# Patient Record
Sex: Male | Born: 1960
Health system: Southern US, Community
[De-identification: ages and names within clinical notes are randomized; demographics above are authoritative.]

## PROBLEM LIST (undated history)

## (undated) DIAGNOSIS — R0600 Dyspnea, unspecified: Secondary | ICD-10-CM

## (undated) DIAGNOSIS — E785 Hyperlipidemia, unspecified: Secondary | ICD-10-CM

## (undated) DIAGNOSIS — Z720 Tobacco use: Secondary | ICD-10-CM

## (undated) DIAGNOSIS — N529 Male erectile dysfunction, unspecified: Secondary | ICD-10-CM

## (undated) DIAGNOSIS — K219 Gastro-esophageal reflux disease without esophagitis: Secondary | ICD-10-CM

## (undated) DIAGNOSIS — M25519 Pain in unspecified shoulder: Secondary | ICD-10-CM

## (undated) HISTORY — DX: Dyspnea, unspecified: R06.00

## (undated) HISTORY — DX: Tobacco use: Z72.0

## (undated) HISTORY — DX: Male erectile dysfunction, unspecified: N52.9

## (undated) HISTORY — DX: Gastro-esophageal reflux disease without esophagitis: K21.9

## (undated) HISTORY — PX: UMBILICAL HERNIA REPAIR: SHX196

## (undated) HISTORY — DX: Pain in unspecified shoulder: M25.519

## (undated) HISTORY — PX: OTHER SURGICAL HISTORY: SHX169

## (undated) HISTORY — DX: Hyperlipidemia, unspecified: E78.5

---

## 1999-03-07 ENCOUNTER — Encounter (INDEPENDENT_AMBULATORY_CARE_PROVIDER_SITE_OTHER): Payer: Self-pay | Admitting: Specialist

## 1999-03-07 ENCOUNTER — Other Ambulatory Visit: Admission: RE | Admit: 1999-03-07 | Discharge: 1999-03-07 | Payer: Self-pay | Admitting: Otolaryngology

## 1999-10-22 HISTORY — PX: COLONOSCOPY: SHX174

## 2003-03-21 ENCOUNTER — Other Ambulatory Visit: Admission: RE | Admit: 2003-03-21 | Discharge: 2003-03-21 | Payer: Self-pay | Admitting: Urology

## 2004-07-31 ENCOUNTER — Emergency Department (HOSPITAL_COMMUNITY): Admission: EM | Admit: 2004-07-31 | Discharge: 2004-07-31 | Payer: Self-pay | Admitting: Emergency Medicine

## 2006-03-03 ENCOUNTER — Ambulatory Visit (HOSPITAL_COMMUNITY): Admission: RE | Admit: 2006-03-03 | Discharge: 2006-03-03 | Payer: Self-pay | Admitting: Family Medicine

## 2006-03-05 ENCOUNTER — Ambulatory Visit (HOSPITAL_COMMUNITY): Admission: RE | Admit: 2006-03-05 | Discharge: 2006-03-05 | Payer: Self-pay | Admitting: Family Medicine

## 2006-03-13 ENCOUNTER — Ambulatory Visit (HOSPITAL_COMMUNITY): Admission: RE | Admit: 2006-03-13 | Discharge: 2006-03-13 | Payer: Self-pay | Admitting: Family Medicine

## 2006-05-02 ENCOUNTER — Ambulatory Visit (HOSPITAL_COMMUNITY): Admission: RE | Admit: 2006-05-02 | Discharge: 2006-05-02 | Payer: Self-pay | Admitting: Internal Medicine

## 2006-05-02 ENCOUNTER — Ambulatory Visit: Payer: Self-pay | Admitting: Internal Medicine

## 2006-05-02 HISTORY — PX: COLONOSCOPY: SHX174

## 2007-07-06 ENCOUNTER — Ambulatory Visit (HOSPITAL_COMMUNITY): Admission: RE | Admit: 2007-07-06 | Discharge: 2007-07-06 | Payer: Self-pay | Admitting: Family Medicine

## 2009-11-24 ENCOUNTER — Ambulatory Visit (HOSPITAL_COMMUNITY): Admission: RE | Admit: 2009-11-24 | Discharge: 2009-11-24 | Payer: Self-pay | Admitting: Family Medicine

## 2010-08-10 NOTE — Op Note (Signed)
NAME:  Andrew Vargas, Andrew Vargas                  ACCOUNT NO.:  [id number]   MEDICAL RECORD NO.:  [id number]          PATIENT TYPE:  AMB   LOCATION:  DAY                           FACILITY:  [facility]   PHYSICIAN:  R. Roetta Sessions, M.D. DATE OF BIRTH:  1960/07/14   DATE OF PROCEDURE:  05/02/2006  DATE OF DISCHARGE:                                PROCEDURE NOTE   PROCEDURE:  Surveillance colonoscopy.   ENDOSCOPIST:  Jonathon Bellows, M.D.   INDICATIONS FOR PROCEDURE:  The patient is a 50 year old gentleman who  was found to have colonic adenomas removed from his colon in 2001.  He  has done well.  He is here for surveillance.  This approach has been  discussed with the patient at length and potential risks, benefits and  alternatives have been reviewed and questions answered; he is agreeable.  Please see documentation in the medical record.   PROCEDURE NOTE:  O2 saturation, blood pressure, pulse and respirations  were monitored throughout the entire procedure.   CONSCIOUS SEDATION:  Versed 5 mg IV, Demerol 100 mg IV in divided doses.   INSTRUMENT:  Pentax video chip system.   FINDINGS:  Digital rectal exam revealed no abnormalities.   ENDOSCOPIC FINDINGS:  Prep was suboptimal, but doable.  The colonic  mucosa was examined from the rectosigmoid junction through the left,  transverse and right colon to the area of the appendiceal orifice,  ileocecal valve and cecum.  These structures were well seen and  photographed for the record.  From this level, the scope was slowly  withdrawn.  All previously mentioned mucosal surfaces were again seen.  The colonic mucosa appeared normal and the scope was pulled down into  the rectum where a thorough examination of the rectal mucosa and a  retroflex view of the anal verge revealed no abnormalities.  The patient  tolerated the procedure well and was reactive after endoscopy.   IMPRESSION:  1. Normal rectum.  2. Normal colon.   RECOMMENDATIONS:  Repeat  surveillance colonoscopy in 5 years.      Jonathon Bellows, M.D.  Electronically Signed     RMR/MEDQ  D:  05/02/2006  T:  05/02/2006  Job:  [id number]   cc:   Donna Bernard, M.D.  Fax: [phone]

## 2010-08-10 NOTE — Procedures (Signed)
NAME:  Andrew Vargas, Andrew Vargas                  ACCOUNT NO.:  [id number]   MEDICAL RECORD NO.:  [id number]          PATIENT TYPE:  OUT   LOCATION:  RESP                          FACILITY:  [facility]   PHYSICIAN:  Edward L. Juanetta Gosling, M.D.DATE OF BIRTH:  02-23-1961   DATE OF PROCEDURE:  DATE OF DISCHARGE:                            PULMONARY FUNCTION TEST   1. Spirometry shows no ventilatory defect but does show some evidence      of airflow obstruction, most marked in the smaller airways.  2. Lung volumes are normal.  3. DLCO is normal.  4. There is no significant bronchodilator improvement.      Edward L. Juanetta Gosling, M.D.  Electronically Signed     ELH/MEDQ  D:  03/13/2006  T:  03/14/2006  Job:  [id number]   cc:   Donna Bernard, M.D.  Fax: [phone]

## 2010-10-22 ENCOUNTER — Other Ambulatory Visit: Payer: Self-pay | Admitting: Family Medicine

## 2010-10-22 DIAGNOSIS — M545 Low back pain, unspecified: Secondary | ICD-10-CM

## 2010-10-24 ENCOUNTER — Ambulatory Visit (HOSPITAL_COMMUNITY)
Admission: RE | Admit: 2010-10-24 | Discharge: 2010-10-24 | Disposition: A | Discharge status: Home / Self Care | Payer: Managed Care, Other (non HMO) | Source: Ambulatory Visit | Attending: Family Medicine | Admitting: Family Medicine

## 2010-10-24 DIAGNOSIS — M545 Low back pain, unspecified: Secondary | ICD-10-CM

## 2010-10-24 DIAGNOSIS — M5126 Other intervertebral disc displacement, lumbar region: Secondary | ICD-10-CM | POA: Insufficient documentation

## 2011-04-08 ENCOUNTER — Encounter: Payer: Self-pay | Admitting: Internal Medicine

## 2011-10-30 ENCOUNTER — Other Ambulatory Visit: Payer: Self-pay | Admitting: Family Medicine

## 2011-10-30 ENCOUNTER — Ambulatory Visit (HOSPITAL_COMMUNITY)
Admission: RE | Admit: 2011-10-30 | Discharge: 2011-10-30 | Disposition: A | Discharge status: Home / Self Care | Payer: Managed Care, Other (non HMO) | Source: Ambulatory Visit | Attending: Family Medicine | Admitting: Family Medicine

## 2011-10-30 DIAGNOSIS — M25511 Pain in right shoulder: Secondary | ICD-10-CM

## 2011-10-30 DIAGNOSIS — M2550 Pain in unspecified joint: Secondary | ICD-10-CM | POA: Insufficient documentation

## 2011-11-06 ENCOUNTER — Encounter: Payer: Self-pay | Admitting: Internal Medicine

## 2011-11-07 ENCOUNTER — Ambulatory Visit (INDEPENDENT_AMBULATORY_CARE_PROVIDER_SITE_OTHER): Payer: Managed Care, Other (non HMO) | Admitting: Gastroenterology

## 2011-11-07 ENCOUNTER — Encounter: Payer: Self-pay | Admitting: Gastroenterology

## 2011-11-07 ENCOUNTER — Encounter (HOSPITAL_COMMUNITY): Payer: Self-pay | Admitting: Pharmacy Technician

## 2011-11-07 ENCOUNTER — Other Ambulatory Visit: Payer: Self-pay | Admitting: Internal Medicine

## 2011-11-07 VITALS — BP 132/87 | HR 79 | Temp 97.6°F | Ht 72.0 in | Wt 201.0 lb

## 2011-11-07 DIAGNOSIS — D126 Benign neoplasm of colon, unspecified: Secondary | ICD-10-CM

## 2011-11-07 DIAGNOSIS — D369 Benign neoplasm, unspecified site: Secondary | ICD-10-CM | POA: Insufficient documentation

## 2011-11-07 DIAGNOSIS — K219 Gastro-esophageal reflux disease without esophagitis: Secondary | ICD-10-CM

## 2011-11-07 MED ORDER — PEG 3350-KCL-NA BICARB-NACL 420 G PO SOLR: 4000.0000 L | ORAL | Status: AC

## 2011-11-07 NOTE — Progress Notes (Signed)
Primary Care Physician:  Harlow Asa, MD Primary Gastroenterologist:  Dr. Jena Gauss  Chief Complaint  Patient presents with  . Colonoscopy    HPI:   51 year old quite healthy male presents today for surveillance colonoscopy, hx of tubular adenoma in past. TCS in 2008 without evidence of polyps. No abdominal pain, change in bowel habits. No rectal bleeding. Denies N/V. Had onset of reflux 3-6 months ago, prescribed a PPI, took for awhile then improved. No longer with any reflux. No dysphagia. No wt loss or lack of appetite.   Past Medical History  Diagnosis Date  . Tobacco abuse   . Shoulder pain     Past Surgical History  Procedure Date  . Colonoscopy 05/02/2006    Normal rectum/Normal colon  . Colonoscopy 10/22/99    tubular adenoma  . Umbilical hernia repair     as a baby  . Embryonal cyst     removed from throat    Current Outpatient Prescriptions  Medication Sig Dispense Refill  . diclofenac (VOLTAREN) 75 MG EC tablet Take 75 mg by mouth 2 (two) times daily.         Allergies as of 11/07/2011  . (No Known Allergies)    Family History  Problem Relation Age of Onset  . Colon cancer Neg Hx     History   Social History  . Marital Status: Married    Spouse Name: N/A    Number of Children: N/A  . Years of Education: N/A   Occupational History  . full-time    Social History Main Topics  . Smoking status: Current Everyday Smoker -- 1.0 packs/day    Types: Cigarettes  . Smokeless tobacco: Not on file  . Alcohol Use: Yes     socially  . Drug Use: No  . Sexually Active: Not on file   Other Topics Concern  . Not on file   Social History Narrative  . No narrative on file    Review of Systems: Gen: Denies any fever, chills, fatigue, weight loss, lack of appetite.  CV: Denies chest pain, heart palpitations, peripheral edema, syncope.  Resp: Denies shortness of breath at rest or with exertion. Denies wheezing or cough.  GI: Denies dysphagia or odynophagia.  Denies jaundice, hematemesis, fecal incontinence. GU : Denies urinary burning, urinary frequency, urinary hesitancy MS: Denies joint pain, muscle weakness, cramps, or limitation of movement.  Derm: Denies rash, itching, dry skin Psych: Denies depression, anxiety, memory loss, and confusion Heme: Denies bruising, bleeding, and enlarged lymph nodes.  Physical Exam: BP 132/87  Pulse 79  Temp 97.6 F (36.4 C) (Temporal)  Ht 6' (1.829 m)  Wt 201 lb (91.173 kg)  BMI 27.26 kg/m2 General:   Alert and oriented. Pleasant and cooperative. Well-nourished and well-developed.  Head:  Normocephalic and atraumatic. Eyes:  Without icterus, sclera clear and conjunctiva pink.  Ears:  Normal auditory acuity. Nose:  No deformity, discharge,  or lesions. Mouth:  No deformity or lesions, oral mucosa pink.  Neck:  Supple, without mass or thyromegaly. Lungs:  Clear to auscultation bilaterally. No wheezes, rales, or rhonchi. No distress.  Heart:  S1, S2 present without murmurs appreciated.  Abdomen:  +BS, soft, non-tender and non-distended. No HSM noted. No guarding or rebound. No masses appreciated.  Rectal:  Deferred  Msk:  Symmetrical without gross deformities. Normal posture. Extremities:  Without clubbing or edema. Neurologic:  Alert and  oriented x4;  grossly normal neurologically. Skin:  Intact without significant lesions or rashes. Cervical Nodes:  No significant cervical adenopathy. Psych:  Alert and cooperative. Normal mood and affect.

## 2011-11-07 NOTE — Progress Notes (Signed)
Faxed to PCP

## 2011-11-07 NOTE — Assessment & Plan Note (Signed)
51 year old with need for surveillance colonoscopy. Last surveillance normal in 2008. Pt without any lower GI symptoms currently.   Proceed with TCS with Dr. Jena Gauss in near future: the risks, benefits, and alternatives have been discussed with the patient in detail. The patient states understanding and desires to proceed.

## 2011-11-07 NOTE — Assessment & Plan Note (Signed)
Onset 3-6 months ago for very short course. Prescribed PPI with resolution. No longer taking PPI. No dysphagia or red flags currently. Discussed with pt importance of contacting us if further issues with GERD.

## 2011-11-07 NOTE — Patient Instructions (Addendum)
We have set you up for a colonoscopy with Dr. Jena Gauss.  If you have any recurrence of reflux, contact our office.

## 2011-11-20 MED ORDER — SODIUM CHLORIDE 0.45 % IV SOLN
INTRAVENOUS | Status: DC
  Administered 2011-11-21: 1000 mL via INTRAVENOUS

## 2011-11-21 ENCOUNTER — Encounter (HOSPITAL_COMMUNITY): Payer: Self-pay | Admitting: *Deleted

## 2011-11-21 ENCOUNTER — Ambulatory Visit (HOSPITAL_COMMUNITY)
Admission: RE | Admit: 2011-11-21 | Discharge: 2011-11-21 | Disposition: A | Discharge status: Home / Self Care | Payer: Managed Care, Other (non HMO) | Source: Ambulatory Visit | Attending: Internal Medicine | Admitting: Internal Medicine

## 2011-11-21 ENCOUNTER — Encounter (HOSPITAL_COMMUNITY)
Admission: RE | Disposition: A | Discharge status: Home / Self Care | Payer: Self-pay | Source: Ambulatory Visit | Attending: Internal Medicine

## 2011-11-21 DIAGNOSIS — Z860100 Personal history of colon polyps, unspecified: Secondary | ICD-10-CM | POA: Insufficient documentation

## 2011-11-21 DIAGNOSIS — D126 Benign neoplasm of colon, unspecified: Secondary | ICD-10-CM

## 2011-11-21 DIAGNOSIS — Z1211 Encounter for screening for malignant neoplasm of colon: Secondary | ICD-10-CM

## 2011-11-21 HISTORY — PX: COLONOSCOPY: SHX5424

## 2011-11-21 SURGERY — COLONOSCOPY
Anesthesia: Moderate Sedation

## 2011-11-21 MED ORDER — MEPERIDINE HCL 100 MG/ML IJ SOLN
INTRAMUSCULAR | Status: AC
  Filled 2011-11-21: qty 2

## 2011-11-21 MED ORDER — MIDAZOLAM HCL 5 MG/5ML IJ SOLN
INTRAMUSCULAR | Status: AC
  Filled 2011-11-21: qty 10

## 2011-11-21 MED ORDER — MIDAZOLAM HCL 5 MG/5ML IJ SOLN
INTRAMUSCULAR | Status: DC | PRN
  Administered 2011-11-21: 1 mg via INTRAVENOUS
  Administered 2011-11-21 (×2): 2 mg via INTRAVENOUS
  Administered 2011-11-21: 1 mg via INTRAVENOUS

## 2011-11-21 MED ORDER — MEPERIDINE HCL 100 MG/ML IJ SOLN
INTRAMUSCULAR | Status: DC | PRN
  Administered 2011-11-21: 50 mg via INTRAVENOUS
  Administered 2011-11-21: 25 mg via INTRAVENOUS
  Administered 2011-11-21: 50 mg via INTRAVENOUS

## 2011-11-21 NOTE — Interval H&P Note (Signed)
History and Physical Interval Note:  11/21/2011 10:15 AM  Andrew Vargas  has presented today for surgery, with the diagnosis of HX OF TUBULAR ADENOMA  The various methods of treatment have been discussed with the patient and family. After consideration of risks, benefits and other options for treatment, the patient has consented to  Procedure(s) (LRB): COLONOSCOPY (N/A) as a surgical intervention .  The patient's history has been reviewed, patient examined, no change in status, stable for surgery.  I have reviewed the patient's chart and labs.  Questions were answered to the patient's satisfaction.     Eula Listen

## 2011-11-21 NOTE — H&P (View-Only) (Signed)
Primary Care Physician:  LUKING,W S, MD Primary Gastroenterologist:  Dr. Rourk  Chief Complaint  Patient presents with  . Colonoscopy    HPI:   51-year-old quite healthy male presents today for surveillance colonoscopy, hx of tubular adenoma in past. TCS in 2008 without evidence of polyps. No abdominal pain, change in bowel habits. No rectal bleeding. Denies N/V. Had onset of reflux 3-6 months ago, prescribed a PPI, took for awhile then improved. No longer with any reflux. No dysphagia. No wt loss or lack of appetite.   Past Medical History  Diagnosis Date  . Tobacco abuse   . Shoulder pain     Past Surgical History  Procedure Date  . Colonoscopy 05/02/2006    Normal rectum/Normal colon  . Colonoscopy 10/22/99    tubular adenoma  . Umbilical hernia repair     as a baby  . Embryonal cyst     removed from throat    Current Outpatient Prescriptions  Medication Sig Dispense Refill  . diclofenac (VOLTAREN) 75 MG EC tablet Take 75 mg by mouth 2 (two) times daily.         Allergies as of 11/07/2011  . (No Known Allergies)    Family History  Problem Relation Age of Onset  . Colon cancer Neg Hx     History   Social History  . Marital Status: Married    Spouse Name: N/A    Number of Children: N/A  . Years of Education: N/A   Occupational History  . full-time    Social History Main Topics  . Smoking status: Current Everyday Smoker -- 1.0 packs/day    Types: Cigarettes  . Smokeless tobacco: Not on file  . Alcohol Use: Yes     socially  . Drug Use: No  . Sexually Active: Not on file   Other Topics Concern  . Not on file   Social History Narrative  . No narrative on file    Review of Systems: Gen: Denies any fever, chills, fatigue, weight loss, lack of appetite.  CV: Denies chest pain, heart palpitations, peripheral edema, syncope.  Resp: Denies shortness of breath at rest or with exertion. Denies wheezing or cough.  GI: Denies dysphagia or odynophagia.  Denies jaundice, hematemesis, fecal incontinence. GU : Denies urinary burning, urinary frequency, urinary hesitancy MS: Denies joint pain, muscle weakness, cramps, or limitation of movement.  Derm: Denies rash, itching, dry skin Psych: Denies depression, anxiety, memory loss, and confusion Heme: Denies bruising, bleeding, and enlarged lymph nodes.  Physical Exam: BP 132/87  Pulse 79  Temp 97.6 F (36.4 C) (Temporal)  Ht 6' (1.829 m)  Wt 201 lb (91.173 kg)  BMI 27.26 kg/m2 General:   Alert and oriented. Pleasant and cooperative. Well-nourished and well-developed.  Head:  Normocephalic and atraumatic. Eyes:  Without icterus, sclera clear and conjunctiva pink.  Ears:  Normal auditory acuity. Nose:  No deformity, discharge,  or lesions. Mouth:  No deformity or lesions, oral mucosa pink.  Neck:  Supple, without mass or thyromegaly. Lungs:  Clear to auscultation bilaterally. No wheezes, rales, or rhonchi. No distress.  Heart:  S1, S2 present without murmurs appreciated.  Abdomen:  +BS, soft, non-tender and non-distended. No HSM noted. No guarding or rebound. No masses appreciated.  Rectal:  Deferred  Msk:  Symmetrical without gross deformities. Normal posture. Extremities:  Without clubbing or edema. Neurologic:  Alert and  oriented x4;  grossly normal neurologically. Skin:  Intact without significant lesions or rashes. Cervical Nodes:    No significant cervical adenopathy. Psych:  Alert and cooperative. Normal mood and affect.    

## 2011-11-21 NOTE — Op Note (Addendum)
[facility] [address] [place], [address]   COLONOSCOPY PROCEDURE REPORT  PATIENT: Kree, Rafter  MR#:         [id number] BIRTHDATE: 06-Apr-1960 , 51  yrs. old GENDER: Male ENDOSCOPIST: R.  Roetta Sessions, MD FACP FACG REFERRED BY:  Simone Curia, M.D. PROCEDURE DATE:  11/21/2011 PROCEDURE:     colonoscopy with snare polypectomy  INDICATIONS: history of colonic adenoma  INFORMED CONSENT:  The risks, benefits, alternatives and imponderables including but not limited to bleeding, perforation as well as the possibility of a missed lesion have been reviewed.  The potential for biopsy, lesion removal, etc. have also been discussed.  Questions have been answered.  All parties agreeable. Please see the history and physical in the medical record for more information.  MEDICATIONS: Versed 6 mg IV and Demerol 125 mg IV in divided doses.  DESCRIPTION OF PROCEDURE:  After a digital rectal exam was performed, the EC-[id number] ([id number])  colonoscope was advanced from the anus through the rectum and colon to the area of the cecum, ileocecal valve and appendiceal orifice.  The cecum was deeply intubated.  These structures were well-seen and photographed for the record.  From the level of the cecum and ileocecal valve, the scope was slowly and cautiously withdrawn.  The mucosal surfaces were carefully surveyed utilizing scope tip deflection to facilitate fold flattening as needed.  The scope was pulled down into the rectum where a thorough examination including retroflexion was performed.    FINDINGS:  adequate preparation. Normal rectum. Somewhat of a redundant and elongated colon which required a number of maneuvers including changing the patient's position and external abdominal pressure to reach the cecum.  The patient was noted to have a 1 x 1.25 cm "carpet" polyp on the distal side of the ileocecal valve. The remainder esophagus appeared normal.  THERAPEUTIC /  DIAGNOSTIC MANEUVERS PERFORMED:  the above-mentioned polyp was removed totally with one pass of a hot snare cautery and recovered for the pathologist.  COMPLICATIONS: none  CECAL WITHDRAWAL TIME:  14 minutes  IMPRESSION:  colonic polyp-removed as described above  RECOMMENDATIONS: Followup on pathology.   _______________________________ eSigned:  R. Roetta Sessions, MD FACP [facility] 11/21/2011 11:04 AM   CC:

## 2011-11-24 ENCOUNTER — Encounter: Payer: Self-pay | Admitting: Internal Medicine

## 2011-11-28 ENCOUNTER — Encounter: Payer: Self-pay | Admitting: *Deleted

## 2011-12-02 ENCOUNTER — Encounter (HOSPITAL_COMMUNITY): Payer: Self-pay | Admitting: Internal Medicine

## 2011-12-10 ENCOUNTER — Other Ambulatory Visit: Payer: Self-pay | Admitting: Family Medicine

## 2011-12-10 DIAGNOSIS — M25511 Pain in right shoulder: Secondary | ICD-10-CM

## 2011-12-12 ENCOUNTER — Ambulatory Visit (HOSPITAL_COMMUNITY)
Admission: RE | Admit: 2011-12-12 | Discharge: 2011-12-12 | Disposition: A | Discharge status: Home / Self Care | Payer: Managed Care, Other (non HMO) | Source: Ambulatory Visit | Attending: Family Medicine | Admitting: Family Medicine

## 2011-12-12 DIAGNOSIS — M25519 Pain in unspecified shoulder: Secondary | ICD-10-CM | POA: Insufficient documentation

## 2011-12-12 DIAGNOSIS — M67919 Unspecified disorder of synovium and tendon, unspecified shoulder: Secondary | ICD-10-CM | POA: Insufficient documentation

## 2011-12-12 DIAGNOSIS — M719 Bursopathy, unspecified: Secondary | ICD-10-CM | POA: Insufficient documentation

## 2011-12-12 DIAGNOSIS — M25511 Pain in right shoulder: Secondary | ICD-10-CM

## 2011-12-23 ENCOUNTER — Ambulatory Visit (HOSPITAL_COMMUNITY)
Admission: RE | Admit: 2011-12-23 | Discharge: 2011-12-23 | Disposition: A | Discharge status: Home / Self Care | Payer: Managed Care, Other (non HMO) | Source: Ambulatory Visit | Attending: Orthopedic Surgery | Admitting: Orthopedic Surgery

## 2011-12-23 DIAGNOSIS — IMO0001 Reserved for inherently not codable concepts without codable children: Secondary | ICD-10-CM | POA: Insufficient documentation

## 2011-12-23 DIAGNOSIS — M7501 Adhesive capsulitis of right shoulder: Secondary | ICD-10-CM | POA: Insufficient documentation

## 2011-12-23 DIAGNOSIS — M25519 Pain in unspecified shoulder: Secondary | ICD-10-CM | POA: Insufficient documentation

## 2011-12-23 DIAGNOSIS — M6281 Muscle weakness (generalized): Secondary | ICD-10-CM | POA: Insufficient documentation

## 2011-12-23 DIAGNOSIS — M25619 Stiffness of unspecified shoulder, not elsewhere classified: Secondary | ICD-10-CM | POA: Insufficient documentation

## 2011-12-23 NOTE — Evaluation (Signed)
Occupational Therapy Evaluation  Patient Details  Name: JESSEJAMES STEELMAN MRN: [id number] Date of Birth: [birth year]  Today's Date: 12/23/2011 Time: 4098-1191 OT Time Calculation (min): 41 min OT Evaluation [id number] Manual Therapy [phone] 20' Visit#: 1  of 16   Re-eval: 01/20/12  Assessment Diagnosis: Right Shoulder Adhesive Capsulitis Prior Therapy: none  Authorization: no authorization required   Past Medical History:  Past Medical History  Diagnosis Date  . Tobacco abuse   . Shoulder pain    Past Surgical History:  Past Surgical History  Procedure Date  . Colonoscopy 05/02/2006    Normal rectum/Normal colon  . Colonoscopy 10/22/99    tubular adenoma  . Umbilical hernia repair     as a baby  . Embryonal cyst     removed from throat  . Colonoscopy 11/21/2011    Procedure: COLONOSCOPY;  Surgeon: Corbin Ade, MD;  Location: [facility];  Service: Endoscopy;  Laterality: N/A;  10:30    Subjective S:  I began having neck pain and pain in my right shoulder in May.  The neck pain went away, but the shoulder pain didn't. Pertinent History: Mr. Gombos began experiencing pain in is right shoulder and neck in   May.  The neck pain went away, but the shoulder pain has remained.  A MRI has detected changes in the suprapinatus tendon, fraying in the labrum, and other changes consistent with adhesive capsulitis.  He has been referred to occupational therapy for evaluation and treatment, focusing on AAROM, PROM, AROM.   Special Tests: UEFI 51% I level. Patient Stated Goals: I want to raise my right arm over had and be able to itch my back.   Pain Assessment Currently in Pain?: No/denies Pain Score: 0-No pain Pain Radiating Towards: Pain can reach a 10/10 with movement, but dissipates within 3 minutes.  Precautions/Restrictions   N/A  Prior Functioning  Home Living Lives With: Family Prior Function Level of Independence: Independent with basic ADLs;Independent with homemaking  with ambulation Driving: Yes Vocation: Full time employment Vocation Requirements: Mr. Sigg works for a [employer]  He is not currently working due to his shoulder. He is out of work through the end of October. Leisure: Hobbies-yes (Comment) Comments: enjoys snow skiing and cutting firewood  Assessment ADL/Vision/Perception ADL ADL Comments: Mr. Vanderberg is having difficulty completing and increased pain when reaching overhead, reaching behind his back, and attempting to lift anything heavy. Dominant Hand: Right  Cognition/Observation Cognition Overall Cognitive Status: Appears within functional limits for tasks assessed  Sensation/Coordination/Edema Sensation Light Touch: Appears Intact Coordination Gross Motor Movements are Fluid and Coordinated: Yes Fine Motor Movements are Fluid and Coordinated: Yes  Additional Assessments RUE AROM (degrees) RUE Overall AROM Comments: assessed in seated  ER/IR with shoulder adducted Right Shoulder Flexion: 95 Degrees Right Shoulder ABduction: 50 Degrees Right Shoulder Internal Rotation: 68 Degrees Right Shoulder External Rotation: 35 Degrees RUE PROM (degrees) RUE Overall PROM Comments: WFL in supine RUE Strength RUE Overall Strength Comments: assessed in seated  ER/IR with shoulder adducted Right Shoulder Flexion:  (4-/5) Right Shoulder ABduction:  (4-/5) Right Shoulder Internal Rotation:  (4-/5) Right Shoulder External Rotation:  (4-/5) Palpation Palpation: moderate fascial restrictions noted in his right upper arm and scapular region.     Exercise/Treatments    Manual Therapy Manual Therapy: Myofascial release Myofascial Release: Manual massage and stretching to right upper arm and scapular region to decrease pain and fascial restrictions and increase pain free mobility.  Occupational Therapy Assessment and Plan  OT Assessment and Plan Clinical Impression Statement: A:  51 year old male with increased pain and  restrictions and decreased functional use of his right shoulder and arm x 4 months.  Patient will benefit from skilled OT interventions to increase AROM and strength and decrease fascial restrictions and pain in his RUE. Pt will benefit from skilled therapeutic intervention in order to improve on the following deficits: Decreased range of motion;Decreased strength;Increased fascial restricitons;Increased muscle spasms;Pain Rehab Potential: Good OT Frequency: Min 2X/week OT Duration: 8 weeks OT Treatment/Interventions: Self-care/ADL training;Therapeutic exercise;Therapeutic activities;Manual therapy;Patient/family education OT Plan: P:  Skilled OT interventioni is indicated to decrease pain and restrictions while increasing AROM and strength needed in RUE to return to PLOF with all daily activities.  Treatment Plan:  MFR and manual stretching in supine.  PROM and AAROM in supine.  seated ext, ret, row, elev.  ball stretches, pulleys.  Progress as tolerated.    Goals Short Term Goals Time to Complete Short Term Goals: 4 weeks Short Term Goal 1: Patient will be educated on a HEP. Short Term Goal 2: Patient will increase AROM in his right shoulder by 50% for increased ability to reach behind his back and overhead. Short Term Goal 3: Patient will increase his right shoulder strength to 4/5 for increased ability to pick up pieces of wood. Short Term Goal 4: Patient will decrease pain in his right shoulder to 4/10 when reaching overhead. Short Term Goal 5: Patient will decrease fascial restrictions to minimal in his right shoulder. Long Term Goals Time to Complete Long Term Goals: 8 weeks Long Term Goal 1: Patient will return to prior level of independence with all daily activities, work, and leisure activities. Long Term Goal 2: Patient will increase AROM in his right shoulder to [facility] for increased ability to reach overhead at work. Long Term Goal 3: Patient will increase his right shoulder strength to  5/5 for increased ability to lift items at work. Long Term Goal 4: Patient will decrease pain level to 1/10 when reaching overhead. Long Term Goal 5: Patient will decrease fascial restrictions to trace in his right shoulder region.  Problem List Patient Active Problem List  Diagnosis  . Tubular adenoma  . GERD (gastroesophageal reflux disease)  . Pain in joint, shoulder region  . Muscle weakness (generalized)  . Adhesive capsulitis of right shoulder    End of Session Activity Tolerance: Patient tolerated treatment well General Behavior During Session: [facility] for tasks performed Cognition: [facility] for tasks performed OT Plan of Care OT Home Exercise Plan: Educated on towel slides, pulleys, and dowel rod stretches. Consulted and Agree with Plan of Care: Patient  GO    Shirlean Mylar, OTR/L  12/23/2011, 1:35 PM  Physician Documentation Your signature is required to indicate approval of the treatment plan as stated above.  Please sign and either send electronically or make a copy of this report for your files and return this physician signed original.  Please mark one 1.__approve of plan  2. ___approve of plan with the following conditions.   ______________________________                                                          _____________________ Physician Signature  Date  

## 2011-12-25 ENCOUNTER — Ambulatory Visit (HOSPITAL_COMMUNITY)
Admission: RE | Admit: 2011-12-25 | Discharge: 2011-12-25 | Disposition: A | Discharge status: Home / Self Care | Payer: Managed Care, Other (non HMO) | Source: Ambulatory Visit | Attending: Orthopedic Surgery | Admitting: Orthopedic Surgery

## 2011-12-25 DIAGNOSIS — M7501 Adhesive capsulitis of right shoulder: Secondary | ICD-10-CM

## 2011-12-25 DIAGNOSIS — M6281 Muscle weakness (generalized): Secondary | ICD-10-CM

## 2011-12-25 DIAGNOSIS — M25619 Stiffness of unspecified shoulder, not elsewhere classified: Secondary | ICD-10-CM | POA: Insufficient documentation

## 2011-12-25 DIAGNOSIS — IMO0001 Reserved for inherently not codable concepts without codable children: Secondary | ICD-10-CM | POA: Insufficient documentation

## 2011-12-25 DIAGNOSIS — M25519 Pain in unspecified shoulder: Secondary | ICD-10-CM | POA: Insufficient documentation

## 2011-12-25 NOTE — Progress Notes (Signed)
Occupational Therapy Treatment Patient Details  Name: Andrew Vargas MRN: [id number] Date of Birth: Jun 13, 1960  Today's Date: 12/25/2011 Time: 7829-5621 OT Time Calculation (min): 33 min Manual Therapy [phone] 16' Therapeutic Exercise [phone] 16'  Visit#: 2  of 16   Re-eval: 01/20/12 Assessment Diagnosis: Right Shoulder Adhesive Capsulitis  Authorization: no authorization required   Authorization Time Period:    Authorization Visit#:   of    Subjective Symptoms/Limitations Symptoms: S:  My pain comes if I lift too quick or too high or behind my back,.  Precautions/Restrictions     Exercise/Treatments Supine Protraction: PROM;AAROM;10 reps Horizontal ABduction: PROM;AAROM;10 reps External Rotation: PROM;AAROM;10 reps Internal Rotation: PROM;AAROM;10 reps Flexion: PROM;AAROM;10 reps ABduction: PROM;AAROM;10 reps Seated Elevation: AROM;10 reps Extension: AROM;10 reps Retraction: AROM;10 reps Row: AROM;10 reps Pulleys Flexion: Other (comment) (add next session) ABduction: Other (comment) (add next session) Therapy Ball Flexion: 15 reps ABduction: 15 reps      Manual Therapy Manual Therapy: Myofascial release Myofascial Release: Manual massage and stretching to right upper arm and scapular region to decrease pain and fascial restrictions and increase pain free mobility.  Occupational Therapy Assessment and Plan OT Assessment and Plan Clinical Impression Statement: A:  Added multiple new exercises which patient tolerated well.  Patient received pain relief from PROM and AAROM with gently traction. OT Plan: P:  Added pulleys next session.   Goals Short Term Goals Time to Complete Short Term Goals: 4 weeks Short Term Goal 1: Patient will be educated on a HEP. Short Term Goal 2: Patient will increase AROM in his right shoulder by 50% for increased ability to reach behind his back and overhead. Short Term Goal 3: Patient will increase his right shoulder strength to 4/5  for increased ability to pick up pieces of wood. Short Term Goal 4: Patient will decrease pain in his right shoulder to 4/10 when reaching overhead. Short Term Goal 5: Patient will decrease fascial restrictions to minimal in his right shoulder. Long Term Goals Time to Complete Long Term Goals: 8 weeks Long Term Goal 1: Patient will return to prior level of independence with all daily activities, work, and leisure activities. Long Term Goal 2: Patient will increase AROM in his right shoulder to [facility] for increased ability to reach overhead at work. Long Term Goal 3: Patient will increase his right shoulder strength to 5/5 for increased ability to lift items at work. Long Term Goal 4: Patient will decrease pain level to 1/10 when reaching overhead. Long Term Goal 5: Patient will decrease fascial restrictions to trace in his right shoulder region.  Problem List Patient Active Problem List  Diagnosis  . Tubular adenoma  . GERD (gastroesophageal reflux disease)  . Pain in joint, shoulder region  . Muscle weakness (generalized)  . Adhesive capsulitis of right shoulder    End of Session Activity Tolerance: Patient tolerated treatment well General Behavior During Session: [facility] for tasks performed Cognition: [facility] for tasks performed  GO   Ernst Cumpston L. Demarius Archila, COTA/L  12/25/2011, 6:10 PM

## 2011-12-31 ENCOUNTER — Ambulatory Visit (HOSPITAL_COMMUNITY)
Admission: RE | Admit: 2011-12-31 | Discharge: 2011-12-31 | Disposition: A | Discharge status: Home / Self Care | Payer: Managed Care, Other (non HMO) | Source: Ambulatory Visit | Attending: Orthopedic Surgery | Admitting: Orthopedic Surgery

## 2011-12-31 DIAGNOSIS — M6281 Muscle weakness (generalized): Secondary | ICD-10-CM

## 2011-12-31 DIAGNOSIS — M7501 Adhesive capsulitis of right shoulder: Secondary | ICD-10-CM

## 2011-12-31 DIAGNOSIS — M25519 Pain in unspecified shoulder: Secondary | ICD-10-CM

## 2011-12-31 NOTE — Progress Notes (Signed)
Occupational Therapy Treatment Patient Details  Name: Andrew Vargas MRN: [id number] Date of Birth: May 27, 1960  Today's Date: 12/31/2011 Time: 7846-9629 OT Time Calculation (min): 46 min Manual Therapy 5284-1324 20' Therapeutic Exercises [phone] 26' Visit#: 3  of 18   Re-eval: 01/28/12    Authorization: no authorization required    Subjective S:  I still wake up once or twice a night because of my shoulder.   Pain Assessment Currently in Pain?: No/denies Pain Score: 0-No pain  Precautions/Restrictions   N/A  Exercise/Treatments Supine Protraction: PROM;10 reps;AAROM;12 reps Horizontal ABduction: PROM;10 reps;AAROM;12 reps External Rotation: PROM;10 reps;AAROM;12 reps Internal Rotation: PROM;10 reps;AAROM;12 reps Flexion: PROM;10 reps;AAROM;12 reps ABduction: PROM;10 reps;AAROM;12 reps Seated Elevation: AROM;12 reps Extension: AROM;12 reps Retraction: AROM;12 reps Row: AROM;12 reps Pulleys Flexion: 1 minute ABduction: 1 minute Therapy Ball Flexion: 20 reps ABduction: 20 reps ROM / Strengthening / Isometric Strengthening Thumb Tacks: 1' Prot/Ret//Elev/Dep: 1'      Manual Therapy Manual Therapy: Myofascial release Myofascial Release: MFR and manual stretching to right upper arm and scapular region to decrease pain and fascial restrictions and increase pain free mobility/ 3664-4034  Occupational Therapy Assessment and Plan OT Assessment and Plan Clinical Impression Statement: A:  Requires frequent vg to remain relaxed during PROM and manual stretching in order to avoid unneccessary pain.   OT Plan: P:  Increase PROM and AAROM by 10 degrees for increased ability to reach overhead at home and work.   Goals Short Term Goals Time to Complete Short Term Goals: 4 weeks Short Term Goal 1: Patient will be educated on a HEP. Short Term Goal 1 Progress: Progressing toward goal Short Term Goal 2: Patient will increase AROM in his right shoulder by 50% for increased  ability to reach behind his back and overhead. Short Term Goal 2 Progress: Progressing toward goal Short Term Goal 3: Patient will increase his right shoulder strength to 4/5 for increased ability to pick up pieces of wood. Short Term Goal 3 Progress: Progressing toward goal Short Term Goal 4: Patient will decrease pain in his right shoulder to 4/10 when reaching overhead. Short Term Goal 4 Progress: Progressing toward goal Short Term Goal 5: Patient will decrease fascial restrictions to minimal in his right shoulder. Short Term Goal 5 Progress: Progressing toward goal Long Term Goals Time to Complete Long Term Goals: 8 weeks Long Term Goal 1: Patient will return to prior level of independence with all daily activities, work, and leisure activities. Long Term Goal 1 Progress: Progressing toward goal Long Term Goal 2: Patient will increase AROM in his right shoulder to [facility] for increased ability to reach overhead at work. Long Term Goal 2 Progress: Progressing toward goal Long Term Goal 3: Patient will increase his right shoulder strength to 5/5 for increased ability to lift items at work. Long Term Goal 3 Progress: Progressing toward goal Long Term Goal 4: Patient will decrease pain level to 1/10 when reaching overhead. Long Term Goal 4 Progress: Progressing toward goal Long Term Goal 5: Patient will decrease fascial restrictions to trace in his right shoulder region. Long Term Goal 5 Progress: Progressing toward goal  Problem List Patient Active Problem List  Diagnosis  . Tubular adenoma  . GERD (gastroesophageal reflux disease)  . Pain in joint, shoulder region  . Muscle weakness (generalized)  . Adhesive capsulitis of right shoulder    End of Session Activity Tolerance: Patient tolerated treatment well General Behavior During Session: [facility] for tasks performed Cognition: [facility] for tasks performed OT  Plan of Care OT Home Exercise Plan: stargazer stretch  GO    Shirlean Mylar, OTR/L  12/31/2011, 11:50 AM

## 2012-01-02 ENCOUNTER — Ambulatory Visit (HOSPITAL_COMMUNITY)
Admission: RE | Admit: 2012-01-02 | Discharge: 2012-01-02 | Disposition: A | Discharge status: Home / Self Care | Payer: Managed Care, Other (non HMO) | Source: Ambulatory Visit | Attending: Orthopedic Surgery | Admitting: Orthopedic Surgery

## 2012-01-02 DIAGNOSIS — M7501 Adhesive capsulitis of right shoulder: Secondary | ICD-10-CM

## 2012-01-02 DIAGNOSIS — M6281 Muscle weakness (generalized): Secondary | ICD-10-CM

## 2012-01-02 DIAGNOSIS — M25519 Pain in unspecified shoulder: Secondary | ICD-10-CM

## 2012-01-02 NOTE — Progress Notes (Signed)
Occupational Therapy Treatment Patient Details  Name: Andrew Vargas MRN: [id number] Date of Birth: [birth year]  Today's Date: 01/02/2012 Time: 1100-1148 OT Time Calculation (min): 48 min Manual Therapy: [phone] 22' Therapeutic Exercise: [id number] 26'  Visit#: 4  of 18   Re-eval: 01/28/12 Assessment Diagnosis: Right Shoulder Adhesive Capsulitis  Authorization: no authorization required  Authorization Time Period:    Authorization Visit#:   of    Subjective Symptoms/Limitations Symptoms: S: My shoulder still wakes me up at night and hurts when I reach over my head, but right now its fine.  Pain Assessment Pain Score: 0-No pain  Precautions/Restrictions   n/a  Exercise/Treatments Supine Protraction: PROM;10 reps;AAROM;15 reps Horizontal ABduction: PROM;10 reps;AAROM;15 reps External Rotation: PROM;10 reps;AAROM;15 reps Internal Rotation: PROM;10 reps;AAROM;15 reps Flexion: PROM;10 reps;AAROM;15 reps ABduction: PROM;10 reps;AAROM;15 reps Seated Elevation: AROM;15 reps Extension: AROM;15 reps Retraction: AROM;15 reps Row: AROM;15 reps Pulleys Flexion:  (1.5 minutes) ABduction:  (1.5 minutes) Therapy Ball Flexion: 20 reps ABduction: 20 reps ROM / Strengthening / Isometric Strengthening Thumb Tacks: 2' Prot/Ret//Elev/Dep: 2'      Manual Therapy Manual Therapy: Myofascial release Myofascial Release: MFR and manual stretching to right upper arm and scapular region to decrease pain and fascial restrictions and increase pain free mobility/ 1100-1122  Occupational Therapy Assessment and Plan OT Assessment and Plan Clinical Impression Statement: A: Increased reps with AAROM and AROM. Pt tolerated well. Increased time with thumb tacks, pro/ret/elev/dep. Pt completed exercises today with improved form with shoulder depressed.  Rehab Potential: Good OT Plan: P: Increase PROM and AAROM by 10 degrees for increased ability to reach overhead at home and at work. Add ex/ir  exercise for increased ROM.    Goals Short Term Goals Time to Complete Short Term Goals: 4 weeks Short Term Goal 1: Patient will be educated on a HEP. Short Term Goal 2: Patient will increase AROM in his right shoulder by 50% for increased ability to reach behind his back and overhead. Short Term Goal 3: Patient will increase his right shoulder strength to 4/5 for increased ability to pick up pieces of wood. Short Term Goal 4: Patient will decrease pain in his right shoulder to 4/10 when reaching overhead. Short Term Goal 5: Patient will decrease fascial restrictions to minimal in his right shoulder. Long Term Goals Time to Complete Long Term Goals: 8 weeks Long Term Goal 1: Patient will return to prior level of independence with all daily activities, work, and leisure activities. Long Term Goal 2: Patient will increase AROM in his right shoulder to [facility] for increased ability to reach overhead at work. Long Term Goal 3: Patient will increase his right shoulder strength to 5/5 for increased ability to lift items at work. Long Term Goal 4: Patient will decrease pain level to 1/10 when reaching overhead. Long Term Goal 5: Patient will decrease fascial restrictions to trace in his right shoulder region.  Problem List Patient Active Problem List  Diagnosis  . Tubular adenoma  . GERD (gastroesophageal reflux disease)  . Pain in joint, shoulder region  . Muscle weakness (generalized)  . Adhesive capsulitis of right shoulder    End of Session Activity Tolerance: Patient tolerated treatment well General Behavior During Session: [facility] for tasks performed Cognition: [facility] for tasks performed  GO   Judi Saa, OTAS  01/02/2012, 10:33 AM

## 2012-01-02 NOTE — Progress Notes (Signed)
Note reviewed by clinical instructor and accurately reflects treatment session.  Roshon Duell L. Nuno Brubacher, COTA/L  

## 2012-01-07 ENCOUNTER — Ambulatory Visit (HOSPITAL_COMMUNITY)
Admission: RE | Admit: 2012-01-07 | Discharge: 2012-01-07 | Disposition: A | Discharge status: Home / Self Care | Payer: Managed Care, Other (non HMO) | Source: Ambulatory Visit | Attending: Orthopedic Surgery | Admitting: Orthopedic Surgery

## 2012-01-07 DIAGNOSIS — M25519 Pain in unspecified shoulder: Secondary | ICD-10-CM

## 2012-01-07 DIAGNOSIS — M7501 Adhesive capsulitis of right shoulder: Secondary | ICD-10-CM

## 2012-01-07 DIAGNOSIS — M6281 Muscle weakness (generalized): Secondary | ICD-10-CM

## 2012-01-07 NOTE — Progress Notes (Signed)
Occupational Therapy Treatment Patient Details  Name: Andrew Vargas MRN: [id number] Date of Birth: 23-Jun-1960  Today's Date: 01/07/2012 Time: 8469-6295 OT Time Calculation (min): 46 min Manual Therapy 2841-3244 24' Therapeutic Exercise 1043-1104 21'  Visit#: 5  of 18   Re-eval: 01/28/12    Authorization: no authorization required   Authorization Time Period:    Authorization Visit#:   of    Subjective Symptoms/Limitations Symptoms: S:  I didn't get to sleep till 6 this morning, I have only had 3 hours of sleep. Pain Assessment Currently in Pain?: No/denies Pain Score: 0-No pain  Precautions/Restrictions     Exercise/Treatments Supine Protraction: PROM;10 reps;AAROM;15 reps Horizontal ABduction: PROM;10 reps;AAROM;15 reps External Rotation: PROM;10 reps;AAROM;15 reps Internal Rotation: PROM;10 reps;AAROM;15 reps Flexion: PROM;10 reps;AAROM;15 reps ABduction: PROM;10 reps;AAROM;15 reps Seated Elevation: AROM;15 reps Extension: AROM;15 reps Retraction: AROM;15 reps Row: AROM;15 reps Pulleys Flexion: 2 minutes ABduction: 2 minutes Therapy Ball Flexion: 20 reps ABduction: 20 reps ROM / Strengthening / Isometric Strengthening Thumb Tacks: 2' Prot/Ret//Elev/Dep: 2'     Manual Therapy Manual Therapy: Myofascial release Myofascial Release: MFR and manual stretching to right upper arm and scapular region to decrease pain and fascial restrictions and increase pain free mobility/   Occupational Therapy Assessment and Plan OT Assessment and Plan Clinical Impression Statement: A:  Resumed pulleys today.  Tolerated increase in PROM and decreased cues for form with exercises. Rehab Potential: Good OT Plan: P:  Add posterior joint mob with manual therapy.   Goals Short Term Goals Time to Complete Short Term Goals: 4 weeks Short Term Goal 1: Patient will be educated on a HEP. Short Term Goal 2: Patient will increase AROM in his right shoulder by 50% for increased ability  to reach behind his back and overhead. Short Term Goal 3: Patient will increase his right shoulder strength to 4/5 for increased ability to pick up pieces of wood. Short Term Goal 4: Patient will decrease pain in his right shoulder to 4/10 when reaching overhead. Short Term Goal 5: Patient will decrease fascial restrictions to minimal in his right shoulder. Long Term Goals Time to Complete Long Term Goals: 8 weeks Long Term Goal 1: Patient will return to prior level of independence with all daily activities, work, and leisure activities. Long Term Goal 2: Patient will increase AROM in his right shoulder to [facility] for increased ability to reach overhead at work. Long Term Goal 3: Patient will increase his right shoulder strength to 5/5 for increased ability to lift items at work. Long Term Goal 4: Patient will decrease pain level to 1/10 when reaching overhead. Long Term Goal 5: Patient will decrease fascial restrictions to trace in his right shoulder region.  Problem List Patient Active Problem List  Diagnosis  . Tubular adenoma  . GERD (gastroesophageal reflux disease)  . Pain in joint, shoulder region  . Muscle weakness (generalized)  . Adhesive capsulitis of right shoulder    End of Session Activity Tolerance: Patient tolerated treatment well General Behavior During Session: [facility] for tasks performed Cognition: [facility] for tasks performed  GO   [name] L. Charisma Charlot, COTA/L  01/07/2012, 12:01 PM

## 2012-01-09 ENCOUNTER — Ambulatory Visit (HOSPITAL_COMMUNITY)
Admission: RE | Admit: 2012-01-09 | Discharge: 2012-01-09 | Disposition: A | Discharge status: Home / Self Care | Payer: Managed Care, Other (non HMO) | Source: Ambulatory Visit | Attending: Orthopedic Surgery | Admitting: Orthopedic Surgery

## 2012-01-09 DIAGNOSIS — M6281 Muscle weakness (generalized): Secondary | ICD-10-CM

## 2012-01-09 DIAGNOSIS — M7501 Adhesive capsulitis of right shoulder: Secondary | ICD-10-CM

## 2012-01-09 DIAGNOSIS — M25519 Pain in unspecified shoulder: Secondary | ICD-10-CM

## 2012-01-09 NOTE — Progress Notes (Signed)
Occupational Therapy Treatment Patient Details  Name: IZIK BINGMAN MRN: [id number] Date of Birth: 1961/02/10  Today's Date: 01/09/2012 Time: 7829-5621 OT Time Calculation (min): 51 min Manual Therapy 3086-5784 22' Therapeutic Exercise 6962-9528 29' Visit#: 6  of 18   Re-eval: 01/15/12 (for MD appointment on 01/16/12)    Authorization: no authorization required  Authorization Time Period:    Authorization Visit#:   of    Subjective S:  I can use it a bit more.  Can wash left side of body better.   Pain Assessment Currently in Pain?: No/denies Pain Score: 0-No pain  Precautions/Restrictions   N/A  Exercise/Treatments Supine Protraction: PROM;10 reps;AAROM;15 reps Horizontal ABduction: PROM;10 reps;AAROM;15 reps External Rotation: PROM;10 reps;AAROM;15 reps Internal Rotation: PROM;10 reps;AAROM;15 reps Flexion: PROM;10 reps;AAROM;15 reps ABduction: PROM;10 reps;AAROM;15 reps Seated Elevation: AROM;15 reps Extension: Theraband;10 reps Theraband Level (Shoulder Extension): Level 2 (Red) Retraction: Theraband;10 reps Theraband Level (Shoulder Retraction): Level 2 (Red) Row: Theraband;10 reps Theraband Level (Shoulder Row): Level 2 (Red) Protraction: AAROM;10 reps Horizontal ABduction: AAROM;10 reps External Rotation: AAROM;10 reps Internal Rotation: AAROM;10 reps Flexion: AAROM;10 reps Abduction: AAROM;10 reps Pulleys Flexion: 2 minutes ABduction: 2 minutes Therapy Ball Flexion: 25 reps ABduction: 25 reps Right/Left: 5 reps ROM / Strengthening / Isometric Strengthening Wall Wash: 1' Thumb Tacks: 1' Prot/Ret//Elev/Dep: 1'      Manual Therapy Manual Therapy: Myofascial release Myofascial Release: MFR and manual stretching to right upper arm and scapular region to decrease pain and fascial restrictions and increase pain free mobility.  posterior capsule stretch performed x 1' 1012-1034   Occupational Therapy Assessment and Plan OT Assessment and Plan Clinical  Impression Statement: A:  Added AAROM in seated as patient has 80% AAROM in supine this date.  Patient demonstrates increased mobility with AAROM compared to PROM. OT Plan: P:  Add AROM in supine.   Goals Short Term Goals Time to Complete Short Term Goals: 4 weeks Short Term Goal 1: Patient will be educated on a HEP. Short Term Goal 1 Progress: Progressing toward goal Short Term Goal 2: Patient will increase AROM in his right shoulder by 50% for increased ability to reach behind his back and overhead. Short Term Goal 2 Progress: Progressing toward goal Short Term Goal 3: Patient will increase his right shoulder strength to 4/5 for increased ability to pick up pieces of wood. Short Term Goal 3 Progress: Progressing toward goal Short Term Goal 4: Patient will decrease pain in his right shoulder to 4/10 when reaching overhead. Short Term Goal 4 Progress: Progressing toward goal Short Term Goal 5: Patient will decrease fascial restrictions to minimal in his right shoulder. Short Term Goal 5 Progress: Progressing toward goal Long Term Goals Time to Complete Long Term Goals: 8 weeks Long Term Goal 1: Patient will return to prior level of independence with all daily activities, work, and leisure activities. Long Term Goal 1 Progress: Progressing toward goal Long Term Goal 2: Patient will increase AROM in his right shoulder to [facility] for increased ability to reach overhead at work. Long Term Goal 2 Progress: Progressing toward goal Long Term Goal 3: Patient will increase his right shoulder strength to 5/5 for increased ability to lift items at work. Long Term Goal 3 Progress: Progressing toward goal Long Term Goal 4: Patient will decrease pain level to 1/10 when reaching overhead. Long Term Goal 4 Progress: Progressing toward goal Long Term Goal 5: Patient will decrease fascial restrictions to trace in his right shoulder region. Long Term Goal 5 Progress: Progressing  toward goal  Problem  List Patient Active Problem List  Diagnosis  . Tubular adenoma  . GERD (gastroesophageal reflux disease)  . Pain in joint, shoulder region  . Muscle weakness (generalized)  . Adhesive capsulitis of right shoulder    End of Session Activity Tolerance: Patient tolerated treatment well General Behavior During Session: [facility] for tasks performed Cognition: [facility] for tasks performed  GO    Shirlean Mylar, OTR/L  01/09/2012, 11:06 AM

## 2012-01-14 ENCOUNTER — Ambulatory Visit (HOSPITAL_COMMUNITY)
Admission: RE | Admit: 2012-01-14 | Discharge: 2012-01-14 | Disposition: A | Discharge status: Home / Self Care | Payer: Managed Care, Other (non HMO) | Source: Ambulatory Visit | Attending: Orthopedic Surgery | Admitting: Orthopedic Surgery

## 2012-01-14 DIAGNOSIS — M25519 Pain in unspecified shoulder: Secondary | ICD-10-CM

## 2012-01-14 DIAGNOSIS — M6281 Muscle weakness (generalized): Secondary | ICD-10-CM

## 2012-01-14 DIAGNOSIS — M7501 Adhesive capsulitis of right shoulder: Secondary | ICD-10-CM

## 2012-01-14 NOTE — Progress Notes (Addendum)
Occupational Therapy Treatment Patient Details  Name: Andrew Vargas MRN: [id number] Date of Birth: 04-06-1960  Today's Date: 01/14/2012 Time: 3086-5784 OT Time Calculation (min): 43 min Manual Therapy 6962-9528 26' Therapeutic Exercise 4132-4401 16' Visit#: 7  of 18   Re-eval: 02/11/12 (appointment today instead of 10/24) Assessment Diagnosis: Right Shoulder Adhesive Capsulitis  Authorization: no authorization required   Authorization Time Period:    Authorization Visit#:   of    Subjective Symptoms/Limitations Symptoms: S:  I was wrong I have a doctors appointment today. Special Tests: UEFI 76% previously 51%.  Precautions/Restrictions     Exercise/Treatments Supine Protraction: PROM;AROM;10 reps Horizontal ABduction: PROM;AROM;10 reps External Rotation: PROM;AROM;10 reps Internal Rotation: PROM;AROM;10 reps Flexion: PROM;AROM;10 reps ABduction: PROM;AROM;10 reps Seated Elevation:  (time, resume next session) Therapy Ball Flexion: 25 reps ABduction: 25 reps Right/Left: 5 reps ROM / Strengthening / Isometric Strengthening Wall Wash: time, resume next session     Manual Therapy Manual Therapy: Myofascial release Myofascial Release: MFR and manual stretching to right upper arm and scapular region to decrease pain and fascial restrictions and increase pain free mobility.   Occupational Therapy Assessment and Plan OT Assessment and Plan Clinical Impression Statement: A:  See progress note for MD visit.  Patient increased UEFI to 76% from 51%.  Changed to AROM in supine. OT Plan: P:  Increse reps with AROM   Goals Short Term Goals Time to Complete Short Term Goals: 4 weeks Short Term Goal 1: Patient will be educated on a HEP. Short Term Goal 1 Progress: Met Short Term Goal 2: Patient will increase AROM in his right shoulder by 50% for increased ability to reach behind his back and overhead. Short Term Goal 2 Progress: Met Short Term Goal 3: Patient will increase  his right shoulder strength to 4/5 for increased ability to pick up pieces of wood. Short Term Goal 3 Progress: Met Short Term Goal 4: Patient will decrease pain in his right shoulder to 4/10 when reaching overhead. Short Term Goal 4 Progress: Met Short Term Goal 5: Patient will decrease fascial restrictions to minimal in his right shoulder. Short Term Goal 5 Progress: Met Long Term Goals Time to Complete Long Term Goals: 8 weeks Long Term Goal 1: Patient will return to prior level of independence with all daily activities, work, and leisure activities. Long Term Goal 1 Progress: Progressing toward goal Long Term Goal 2: Patient will increase AROM in his right shoulder to [facility] for increased ability to reach overhead at work. Long Term Goal 2 Progress: Progressing toward goal Long Term Goal 3: Patient will increase his right shoulder strength to 5/5 for increased ability to lift items at work. Long Term Goal 3 Progress: Progressing toward goal Long Term Goal 4: Patient will decrease pain level to 1/10 when reaching overhead. Long Term Goal 4 Progress: Progressing toward goal Long Term Goal 5: Patient will decrease fascial restrictions to trace in his right shoulder region. Long Term Goal 5 Progress: Progressing toward goal  Problem List Patient Active Problem List  Diagnosis  . Tubular adenoma  . GERD (gastroesophageal reflux disease)  . Pain in joint, shoulder region  . Muscle weakness (generalized)  . Adhesive capsulitis of right shoulder    End of Session Activity Tolerance: Patient tolerated treatment well General Behavior During Session: [facility] for tasks performed Cognition: [facility] for tasks performed  GO   Ahmon Tosi L. Kamla Skilton, COTA/L  01/14/2012, 11:55 AM

## 2012-01-15 ENCOUNTER — Inpatient Hospital Stay (HOSPITAL_COMMUNITY)
Admission: RE | Admit: 2012-01-15 | Payer: Managed Care, Other (non HMO) | Source: Ambulatory Visit | Admitting: Specialist

## 2012-01-16 ENCOUNTER — Ambulatory Visit (HOSPITAL_COMMUNITY)
Admission: RE | Admit: 2012-01-16 | Discharge: 2012-01-16 | Disposition: A | Discharge status: Home / Self Care | Payer: Managed Care, Other (non HMO) | Source: Ambulatory Visit | Attending: Orthopedic Surgery | Admitting: Orthopedic Surgery

## 2012-01-16 DIAGNOSIS — M6281 Muscle weakness (generalized): Secondary | ICD-10-CM

## 2012-01-16 DIAGNOSIS — M7501 Adhesive capsulitis of right shoulder: Secondary | ICD-10-CM

## 2012-01-16 DIAGNOSIS — M25519 Pain in unspecified shoulder: Secondary | ICD-10-CM

## 2012-01-16 NOTE — Progress Notes (Addendum)
Occupational Therapy Treatment Patient Details  Name: Andrew Vargas MRN: [id number] Date of Birth: Jul 24, 1960  Today's Date: 01/16/2012 Time: 0315-0407 OT Time Calculation (min): 52 min Manual Therapy [phone] 29' Therapeutic Exercise [phone] 22'  Visit#: 8  of 18   Re-eval: 02/11/12    Authorization: no authorization required   Authorization Time Period:    Authorization Visit#:   of    Subjective Symptoms/Limitations Symptoms: S:  The doctor said he is happy with the way it is going. Pain Assessment Currently in Pain?: No/denies  Precautions/Restrictions     Exercise/Treatments Supine Protraction: PROM;AROM;12 reps Horizontal ABduction: PROM;AROM;12 reps External Rotation: PROM;AROM;12 reps Internal Rotation: PROM;AROM;12 reps Flexion: PROM;AROM;12 reps ABduction: PROM;AROM;12 reps Seated Extension: Theraband;10 reps Theraband Level (Shoulder Extension): Level 3 (Green) Retraction: Theraband;10 reps Theraband Level (Shoulder Retraction): Level 3 (Green) Row: Theraband;10 reps Theraband Level (Shoulder Row): Level 3 (Green) Protraction: AAROM;12 reps Horizontal ABduction: AAROM;12 reps External Rotation: AAROM;12 reps Theraband Level (Shoulder External Rotation): Level 3 (Green) Internal Rotation: AAROM;12 reps Theraband Level (Shoulder Internal Rotation): Level 3 (Green) Flexion: AAROM;12 reps Abduction: AAROM;12 reps  Flexion: 25 reps ABduction: 25 reps Right/Left: 5 reps ROM / Strengthening / Isometric Strengthening Wall Wash: 2' Thumb Tacks: 1' Prot/Ret//Elev/Dep: 1'    Manual Therapy Manual Therapy: Myofascial release Myofascial Release: MFR and manual stretching to right upper arm and scapular region to decrease pain and fascial restrictions and increase pain free mobility.  Occupational Therapy Assessment and Plan OT Assessment and Plan Clinical Impression Statement: A:  Resumed all missed exercises.  Cues to keep shoulder depressed with  pulleys. OT Plan: P:  Increase PROM by 5 degrees.   Goals Short Term Goals Time to Complete Short Term Goals: 4 weeks Short Term Goal 1: Patient will be educated on a HEP. Short Term Goal 2: Patient will increase AROM in his right shoulder by 50% for increased ability to reach behind his back and overhead. Short Term Goal 3: Patient will increase his right shoulder strength to 4/5 for increased ability to pick up pieces of wood. Short Term Goal 4: Patient will decrease pain in his right shoulder to 4/10 when reaching overhead. Short Term Goal 5: Patient will decrease fascial restrictions to minimal in his right shoulder. Long Term Goals Time to Complete Long Term Goals: 8 weeks Long Term Goal 1: Patient will return to prior level of independence with all daily activities, work, and leisure activities. Long Term Goal 2: Patient will increase AROM in his right shoulder to [facility] for increased ability to reach overhead at work. Long Term Goal 3: Patient will increase his right shoulder strength to 5/5 for increased ability to lift items at work. Long Term Goal 4: Patient will decrease pain level to 1/10 when reaching overhead. Long Term Goal 5: Patient will decrease fascial restrictions to trace in his right shoulder region.  Problem List Patient Active Problem List  Diagnosis  . Tubular adenoma  . GERD (gastroesophageal reflux disease)  . Pain in joint, shoulder region  . Muscle weakness (generalized)  . Adhesive capsulitis of right shoulder    End of Session Activity Tolerance: Patient tolerated treatment well General Behavior During Session: [facility] for tasks performed Cognition: [facility] for tasks performed  GO   Marcella Charlson L. Marrion Finan, COTA/L  01/16/2012, 5:49 PM

## 2012-01-20 ENCOUNTER — Ambulatory Visit (HOSPITAL_COMMUNITY)
Admission: RE | Admit: 2012-01-20 | Discharge: 2012-01-20 | Disposition: A | Discharge status: Home / Self Care | Payer: Managed Care, Other (non HMO) | Source: Ambulatory Visit | Attending: Orthopedic Surgery | Admitting: Orthopedic Surgery

## 2012-01-20 DIAGNOSIS — M25519 Pain in unspecified shoulder: Secondary | ICD-10-CM

## 2012-01-20 DIAGNOSIS — M7501 Adhesive capsulitis of right shoulder: Secondary | ICD-10-CM

## 2012-01-20 DIAGNOSIS — M6281 Muscle weakness (generalized): Secondary | ICD-10-CM

## 2012-01-20 NOTE — Progress Notes (Signed)
Occupational Therapy Treatment Patient Details  Name: Andrew Vargas MRN: [id number] Date of Birth: Nov 17, 1960  Today's Date: 01/20/2012 Time: 1110-1205 OT Time Calculation (min): 55 min Manual Therapy 4166-0630 19' Therapeutic Exercises 1130-1205 35' Visit#: 9  of 18   Re-eval: 02/11/12    Authorization: no authorization required    Subjective S:  Its getting better. Pain Assessment Currently in Pain?: No/denies Pain Score: 0-No pain  Precautions/Restrictions   Progress as tolerated.  Exercise/Treatments Supine Protraction: PROM;10 reps;AROM;12 reps Horizontal ABduction: PROM;10 reps;AROM;12 reps External Rotation: PROM;10 reps;AROM;12 reps Internal Rotation: PROM;10 reps;AROM;12 reps Flexion: PROM;10 reps;AROM;12 reps ABduction: PROM;10 reps;AROM;12 reps Seated Elevation: AROM;15 reps Extension: Theraband;15 reps Theraband Level (Shoulder Extension): Level 3 (Green) Retraction: Theraband;15 reps Theraband Level (Shoulder Retraction): Level 3 (Green) Row: Theraband;15 reps Theraband Level (Shoulder Row): Level 3 (Green) Protraction: AROM;10 reps Horizontal ABduction: AROM;10 reps External Rotation: AAROM;12 reps;Theraband;15 reps Theraband Level (Shoulder External Rotation): Level 3 (Green) Internal Rotation: AAROM;12 reps;Theraband;15 reps Theraband Level (Shoulder Internal Rotation): Level 3 (Green) Flexion: AAROM;12 reps Abduction: AAROM;12 reps Pulleys Flexion: 2 minutes ABduction: 2 minutes Therapy Ball Flexion: 25 reps ABduction: 25 reps Right/Left: 5 reps ROM / Strengthening / Isometric Strengthening Wall Wash: 3' Thumb Tacks: 1' Prot/Ret//Elev/Dep: 1'      Manual Therapy Manual Therapy: Myofascial release Myofascial Release: MFR and manual stretching to right upper arm and scapular region to decrease pain and fascial restrictions and increase pain free mobility.[id number]  Occupational Therapy Assessment and Plan OT Assessment and Plan Clinical  Impression Statement: A:  Added AROM for protraction, horizontal abduction in seated.  Demontrates near full flexion with wall wash, therefore will add AROM flexion at next visit.  VG to maintain depressed scapula during all exercises.  With minimal cuing patient able to correct posture without tactile cuing. OT Plan: P:  Add AROM flexion in seated.  Increase ability to maintain depressed scapula without vg.  DC flexion on pullies, continue abduction.   Goals Short Term Goals Time to Complete Short Term Goals: 4 weeks Short Term Goal 1: Patient will be educated on a HEP. Short Term Goal 2: Patient will increase AROM in his right shoulder by 50% for increased ability to reach behind his back and overhead. Short Term Goal 3: Patient will increase his right shoulder strength to 4/5 for increased ability to pick up pieces of wood. Short Term Goal 4: Patient will decrease pain in his right shoulder to 4/10 when reaching overhead. Short Term Goal 5: Patient will decrease fascial restrictions to minimal in his right shoulder. Long Term Goals Time to Complete Long Term Goals: 8 weeks Long Term Goal 1: Patient will return to prior level of independence with all daily activities, work, and leisure activities. Long Term Goal 1 Progress: Progressing toward goal Long Term Goal 2: Patient will increase AROM in his right shoulder to [facility] for increased ability to reach overhead at work. Long Term Goal 2 Progress: Progressing toward goal Long Term Goal 3: Patient will increase his right shoulder strength to 5/5 for increased ability to lift items at work. Long Term Goal 3 Progress: Progressing toward goal Long Term Goal 4: Patient will decrease pain level to 1/10 when reaching overhead. Long Term Goal 4 Progress: Progressing toward goal Long Term Goal 5: Patient will decrease fascial restrictions to trace in his right shoulder region. Long Term Goal 5 Progress: Progressing toward goal  Problem List Patient  Active Problem List  Diagnosis  . Tubular adenoma  . GERD (gastroesophageal reflux  disease)  . Pain in joint, shoulder region  . Muscle weakness (generalized)  . Adhesive capsulitis of right shoulder    End of Session Activity Tolerance: Patient tolerated treatment well General Behavior During Session: [facility] for tasks performed Cognition: [facility] for tasks performed OT Plan of Care OT Home Exercise Plan: Tband scapular exercises.  GO    Shirlean Mylar, OTR/L  01/20/2012, 12:07 PM

## 2012-01-23 ENCOUNTER — Ambulatory Visit (HOSPITAL_COMMUNITY)
Admission: RE | Admit: 2012-01-23 | Discharge: 2012-01-23 | Disposition: A | Discharge status: Home / Self Care | Payer: Managed Care, Other (non HMO) | Source: Ambulatory Visit | Attending: Orthopedic Surgery | Admitting: Orthopedic Surgery

## 2012-01-23 DIAGNOSIS — M25519 Pain in unspecified shoulder: Secondary | ICD-10-CM

## 2012-01-23 DIAGNOSIS — M7501 Adhesive capsulitis of right shoulder: Secondary | ICD-10-CM

## 2012-01-23 DIAGNOSIS — M6281 Muscle weakness (generalized): Secondary | ICD-10-CM

## 2012-01-23 NOTE — Progress Notes (Signed)
Occupational Therapy Treatment Patient Details  Name: Andrew Vargas MRN: [id number] Date of Birth: 06-May-1960  Today's Date: 01/23/2012 Time: 1023-1106 OT Time Calculation (min): 43 min Manual Therapy [id number] 16' Therapeutic Exercises [phone] 10' Visit#: 10  of 18   Re-eval: 02/11/12    Authorization: no authorization required   Subjective  S:  That made it feel looser (mobilization techniques)  Precautions/Restrictions   progress as tolerated  Exercise/Treatments Supine Protraction: PROM;10 reps;AROM;15 reps Horizontal ABduction: PROM;10 reps;AROM;15 reps External Rotation: PROM;10 reps;AROM;15 reps Internal Rotation: PROM;10 reps;AROM;15 reps Flexion: PROM;10 reps;AROM;15 reps ABduction: PROM;10 reps;AROM;15 reps Seated Elevation: AROM;15 reps Extension: Theraband;15 reps Theraband Level (Shoulder Extension): Level 3 (Green) Retraction: Theraband;15 reps Theraband Level (Shoulder Retraction): Level 3 (Green) Row: Theraband;15 reps Theraband Level (Shoulder Row): Level 3 (Green) Protraction: AROM;12 reps Horizontal ABduction: AROM;12 reps External Rotation: AROM;12 reps;Theraband;15 reps Theraband Level (Shoulder External Rotation): Level 2 (Red) Internal Rotation: AROM;12 reps;Theraband;15 reps Theraband Level (Shoulder Internal Rotation): Level 3 (Green) Flexion: AROM;12 reps Abduction: AROM;12 reps Pulleys ABduction: 3 minutes ROM / Strengthening / Isometric Strengthening Thumb Tacks: 1' Proximal Shoulder Strengthening, Seated: 10 reps each  Prot/Ret//Elev/Dep: 1'      Manual Therapy Manual Therapy: Joint mobilization Joint Mobilization: in supine with towel under scapular mobilizations and distraction to posterior shoulder capsule and into abduction Myofascial Release: MFR and manual stretching to right upper arm and scapular region to decrease pain and fascial restrictions and increase pain free mobility.  Occupational Therapy Assessment and Plan OT  Assessment and Plan Clinical Impression Statement: A:  Added abduction and flexion AROM in seated.  Added proximal shoulder strengthening in seated x 10 reps each.  Patient demonstrates good form with all exercises, requiring only occasional vg to maintain depressed shoulder blade.  OT Plan: P:  Continue joint mobs for increased mobility in his right shoulder.  Add w and x to v arms in seated.     Goals Short Term Goals Time to Complete Short Term Goals: 4 weeks Short Term Goal 1: Patient will be educated on a HEP. Short Term Goal 2: Patient will increase AROM in his right shoulder by 50% for increased ability to reach behind his back and overhead. Short Term Goal 3: Patient will increase his right shoulder strength to 4/5 for increased ability to pick up pieces of wood. Short Term Goal 4: Patient will decrease pain in his right shoulder to 4/10 when reaching overhead. Short Term Goal 5: Patient will decrease fascial restrictions to minimal in his right shoulder. Long Term Goals Time to Complete Long Term Goals: 8 weeks Long Term Goal 1: Patient will return to prior level of independence with all daily activities, work, and leisure activities. Long Term Goal 1 Progress: Progressing toward goal Long Term Goal 2: Patient will increase AROM in his right shoulder to [facility] for increased ability to reach overhead at work. Long Term Goal 2 Progress: Progressing toward goal Long Term Goal 3: Patient will increase his right shoulder strength to 5/5 for increased ability to lift items at work. Long Term Goal 3 Progress: Progressing toward goal Long Term Goal 4: Patient will decrease pain level to 1/10 when reaching overhead. Long Term Goal 4 Progress: Progressing toward goal Long Term Goal 5: Patient will decrease fascial restrictions to trace in his right shoulder region. Long Term Goal 5 Progress: Progressing toward goal  Problem List Patient Active Problem List  Diagnosis  . Tubular adenoma  .  GERD (gastroesophageal reflux disease)  . Pain in  joint, shoulder region  . Muscle weakness (generalized)  . Adhesive capsulitis of right shoulder    End of Session Activity Tolerance: Patient tolerated treatment well General Behavior During Session: [facility] for tasks performed Cognition: [facility] for tasks performed  GO    Shirlean Mylar, OTR/L  01/23/2012, 11:32 AM

## 2012-01-27 ENCOUNTER — Ambulatory Visit (HOSPITAL_COMMUNITY)
Admission: RE | Admit: 2012-01-27 | Discharge: 2012-01-27 | Disposition: A | Discharge status: Home / Self Care | Payer: Managed Care, Other (non HMO) | Source: Ambulatory Visit | Attending: Orthopedic Surgery | Admitting: Orthopedic Surgery

## 2012-01-27 DIAGNOSIS — M25519 Pain in unspecified shoulder: Secondary | ICD-10-CM

## 2012-01-27 DIAGNOSIS — IMO0001 Reserved for inherently not codable concepts without codable children: Secondary | ICD-10-CM | POA: Insufficient documentation

## 2012-01-27 DIAGNOSIS — M7501 Adhesive capsulitis of right shoulder: Secondary | ICD-10-CM

## 2012-01-27 DIAGNOSIS — M6281 Muscle weakness (generalized): Secondary | ICD-10-CM

## 2012-01-27 DIAGNOSIS — M25619 Stiffness of unspecified shoulder, not elsewhere classified: Secondary | ICD-10-CM | POA: Insufficient documentation

## 2012-01-27 NOTE — Progress Notes (Signed)
Occupational Therapy Treatment Patient Details  Name: BRET KLOCKO MRN: [id number] Date of Birth: [birth year]  Today's Date: 01/27/2012 Time: 4098-1191 OT Time Calculation (min): 43 min Manual Therapy 4782-9562 19' Therapeutic Exercise 1308-6578 24'  Visit#: 11  of 18   Re-eval: 02/11/12    Authorization: no authorization required   Authorization Time Period:    Authorization Visit#:   of    Subjective Symptoms/Limitations Symptoms: S:  It is a little tight but I just got up. Pain Assessment Currently in Pain?: No/denies Pain Score: 0-No pain  Precautions/Restrictions     Exercise/Treatments   01/27/12 1100 Shoulder Exercises: Supine Protraction PROM;10 reps;AROM;20 reps Horizontal ABduction PROM;10 reps;AROM;20 reps External Rotation PROM;10 reps;AROM;20 reps Internal Rotation PROM;10 reps;AROM;20 reps Flexion PROM;10 reps;AROM;20 reps ABduction PROM;10 reps;AROM;20 reps Shoulder Exercises: Seated Elevation AROM;15 reps Extension Theraband;15 reps Theraband Level (Shoulder Extension) Level 3 (Green) Retraction Theraband;15 reps Theraband Level (Shoulder Retraction) Level 3 (Green) Row [employer] reps Theraband Level (Shoulder Row) Level 3 (Green) Protraction AROM;15 reps Horizontal ABduction AROM;15 reps External Rotation AROM;Theraband;15 reps Theraband Level (Shoulder External Rotation) Level 3 (Green) Internal Rotation AROM;Theraband;15 reps Theraband Level (Shoulder Internal Rotation) Level 3 (Green) Flexion AROM;15 reps Abduction AROM;15 reps Shoulder Exercises: Pulleys Flexion Other (comment) (d/c) ABduction 3 minutes Shoulder Exercises: Therapy Ball Flexion 25 reps ABduction 25 reps Right/Left 5 reps Shoulder Exercises: ROM/Strengthening Wall Wash 4' Thumb Tacks 1' Proximal Shoulder Strengthening, Seated 15 reps each Prot/Ret//Elev/Dep 1'        Manual Therapy Manual Therapy: Joint mobilization Joint Mobilization: supine with towel  under scapular mobilizations and distraction to posterior shoulder capsule and into abduction  Myofascial Release: MFR and manual stretching to right upper arm and scapular region to decrease pain and fascial restrictions and increase pain free mobility   Occupational Therapy Assessment and Plan OT Assessment and Plan Clinical Impression Statement: A:  Added x to v and w arms, d/c flexion on pulleys secondary to [facility] ROM. Rehab Potential: Good OT Plan: P: Attempt 1# in supine.   Goals Short Term Goals Time to Complete Short Term Goals: 4 weeks Short Term Goal 1: Patient will be educated on a HEP. Short Term Goal 2: Patient will increase AROM in his right shoulder by 50% for increased ability to reach behind his back and overhead. Short Term Goal 3: Patient will increase his right shoulder strength to 4/5 for increased ability to pick up pieces of wood. Short Term Goal 4: Patient will decrease pain in his right shoulder to 4/10 when reaching overhead. Short Term Goal 5: Patient will decrease fascial restrictions to minimal in his right shoulder. Long Term Goals Time to Complete Long Term Goals: 8 weeks Long Term Goal 1: Patient will return to prior level of independence with all daily activities, work, and leisure activities. Long Term Goal 2: Patient will increase AROM in his right shoulder to [facility] for increased ability to reach overhead at work. Long Term Goal 3: Patient will increase his right shoulder strength to 5/5 for increased ability to lift items at work. Long Term Goal 4: Patient will decrease pain level to 1/10 when reaching overhead. Long Term Goal 5: Patient will decrease fascial restrictions to trace in his right shoulder region.  Problem List Patient Active Problem List  Diagnosis  . Tubular adenoma  . GERD (gastroesophageal reflux disease)  . Pain in joint, shoulder region  . Muscle weakness (generalized)  . Adhesive capsulitis of right shoulder    End of  Session Activity Tolerance:  Patient tolerated treatment well General Behavior During Session: [facility] for tasks performed Cognition: [facility] for tasks performed  GO   Raeonna Milo L. Shaleka Brines, COTA/L  01/27/2012, 4:32 PM

## 2012-01-30 ENCOUNTER — Ambulatory Visit (HOSPITAL_COMMUNITY)
Admission: RE | Admit: 2012-01-30 | Discharge: 2012-01-30 | Disposition: A | Discharge status: Home / Self Care | Payer: Managed Care, Other (non HMO) | Source: Ambulatory Visit | Attending: Orthopedic Surgery | Admitting: Orthopedic Surgery

## 2012-01-30 DIAGNOSIS — M25519 Pain in unspecified shoulder: Secondary | ICD-10-CM

## 2012-01-30 DIAGNOSIS — M7501 Adhesive capsulitis of right shoulder: Secondary | ICD-10-CM

## 2012-01-30 DIAGNOSIS — M6281 Muscle weakness (generalized): Secondary | ICD-10-CM

## 2012-01-30 NOTE — Progress Notes (Signed)
Occupational Therapy Treatment Patient Details  Name: Andrew Vargas MRN: [id number] Date of Birth: 1961/02/03  Today's Date: 01/30/2012 Time: 7829-5621 OT Time Calculation (min): 40 min Manual Therapy 3086-5784 20' Therapeutic Exercises [phone] 20' Visit#: 12  of 18   Re-eval: 02/11/12    Authorization: no authorization required   Subjective  S:  Its getting easier to use my arm, I think its about 80% Pain Assessment Currently in Pain?: No/denies  Precautions/Restrictions   progress as tolerated  Exercise/Treatments Supine Protraction: PROM;Strengthening;10 reps Protraction Weight (lbs): 1 Horizontal ABduction: PROM;Strengthening;10 reps Horizontal ABduction Weight (lbs): 1 External Rotation: PROM;Strengthening;10 reps External Rotation Weight (lbs): 1 Internal Rotation: PROM;Strengthening;10 reps Internal Rotation Weight (lbs): 1 Flexion: PROM;Strengthening;10 reps Shoulder Flexion Weight (lbs): 1 ABduction: PROM;Strengthening;10 reps Shoulder ABduction Weight (lbs): 1 Seated Extension: Strengthening;10 reps Extension Weight (lbs): 1 Protraction: Strengthening;10 reps Protraction Weight (lbs): 1 Horizontal ABduction: Strengthening Horizontal ABduction Weight (lbs): 1 External Rotation: Strengthening;10 reps External Rotation Weight (lbs): 1 Internal Rotation: Strengthening;10 reps Internal Rotation Weight (lbs): 1 Flexion: AROM;15 reps;Limitations Flexion Limitations: recommended visualizing a balloon floating his wrist up for increased I with positioning and form.   Abduction: AROM;15 reps;Limitations ABduction Limitations: recommended visualizing a balloon floating his wrist up for increased I with positioning and form. Pulleys Flexion:  (dc all pulley exercises) Therapy Ball Flexion:  (dc) ROM / Strengthening / Isometric Strengthening UBE (Upper Arm Bike): 3' and 3' 1.0 Wall Wash: omit this session, resume in combo with ball on wall exercise  Thumb Tacks:  dc "W" Arms: 10 reps X to V Arms: 10 reps Proximal Shoulder Strengthening, Supine: 10 reps with 1# in supine Prot/Ret//Elev/Dep: dc      Manual Therapy Manual Therapy: Myofascial release Myofascial Release: MFR and manual stretching to right upper arm and scapular region to decrease pain and fascial restrictions and increase pain free mobility   Occupational Therapy Assessment and Plan OT Assessment and Plan Clinical Impression Statement: A:  Added x to v arms and w arms.  Added 1 pound resistance to supine exercises and some seated exercises that were full range.   OT Plan: P:  Increase reps with strengthening exercises, decrease amount of vg and facilitation with flexion and abduction.   Goals Short Term Goals Time to Complete Short Term Goals: 4 weeks Short Term Goal 1: Patient will be educated on a HEP. Short Term Goal 1 Progress: Met Short Term Goal 2: Patient will increase AROM in his right shoulder by 50% for increased ability to reach behind his back and overhead. Short Term Goal 2 Progress: Met Short Term Goal 3: Patient will increase his right shoulder strength to 4/5 for increased ability to pick up pieces of wood. Short Term Goal 3 Progress: Met Short Term Goal 4: Patient will decrease pain in his right shoulder to 4/10 when reaching overhead. Short Term Goal 4 Progress: Met Short Term Goal 5: Patient will decrease fascial restrictions to minimal in his right shoulder. Short Term Goal 5 Progress: Met Long Term Goals Time to Complete Long Term Goals: 8 weeks Long Term Goal 1: Patient will return to prior level of independence with all daily activities, work, and leisure activities. Long Term Goal 1 Progress: Progressing toward goal Long Term Goal 2: Patient will increase AROM in his right shoulder to [facility] for increased ability to reach overhead at work. Long Term Goal 2 Progress: Progressing toward goal Long Term Goal 3: Patient will increase his right shoulder strength  to 5/5 for increased ability  to lift items at work. Long Term Goal 3 Progress: Progressing toward goal Long Term Goal 4: Patient will decrease pain level to 1/10 when reaching overhead. Long Term Goal 4 Progress: Progressing toward goal Long Term Goal 5: Patient will decrease fascial restrictions to trace in his right shoulder region. Long Term Goal 5 Progress: Progressing toward goal  Problem List Patient Active Problem List  Diagnosis  . Tubular adenoma  . GERD (gastroesophageal reflux disease)  . Pain in joint, shoulder region  . Muscle weakness (generalized)  . Adhesive capsulitis of right shoulder    End of Session Activity Tolerance: Patient tolerated treatment well General Behavior During Session: [facility] for tasks performed Cognition: [facility] for tasks performed    Shirlean Mylar, OTR/L  01/30/2012, 1:49 PM

## 2012-02-03 ENCOUNTER — Ambulatory Visit (HOSPITAL_COMMUNITY)
Admission: RE | Admit: 2012-02-03 | Discharge: 2012-02-03 | Disposition: A | Discharge status: Home / Self Care | Payer: Managed Care, Other (non HMO) | Source: Ambulatory Visit | Attending: Family Medicine | Admitting: Family Medicine

## 2012-02-03 DIAGNOSIS — M6281 Muscle weakness (generalized): Secondary | ICD-10-CM

## 2012-02-03 DIAGNOSIS — M25519 Pain in unspecified shoulder: Secondary | ICD-10-CM

## 2012-02-03 DIAGNOSIS — M7501 Adhesive capsulitis of right shoulder: Secondary | ICD-10-CM

## 2012-02-03 NOTE — Progress Notes (Signed)
Occupational Therapy Treatment Patient Details  Name: Andrew Vargas MRN: [id number] Date of Birth: 02/11/1961  Today's Date: 02/03/2012 Time: 4098-1191 OT Time Calculation (min): 41 min Manual Therapy 4782-9562 15' Therapeutic Exercise 1308-6578 258'  Visit#: 13  of 18   Re-eval: 02/11/12    Authorization: no authorization required   Authorization Time Period:    Authorization Visit#:   of    Subjective Symptoms/Limitations Symptoms: S: I was using a log splitter yesterday, my arm did pretty good operating the lever. Pain Assessment Currently in Pain?: No/denies Pain Score: 0-No pain  Precautions/Restrictions     Exercise/Treatments Supine Protraction: PROM;Strengthening;12 reps Protraction Weight (lbs): 1 Horizontal ABduction: PROM;Strengthening;12 reps Horizontal ABduction Weight (lbs): 1 External Rotation: PROM;Strengthening;12 reps External Rotation Weight (lbs): 1 Internal Rotation: PROM;Strengthening;12 reps Internal Rotation Weight (lbs): 1 Flexion: PROM;Strengthening;12 reps Shoulder Flexion Weight (lbs): 1 ABduction: PROM;Strengthening;12 reps Shoulder ABduction Weight (lbs): 1 Seated Extension: Theraband;15 reps Theraband Level (Shoulder Extension): Level 3 (Green) Retraction: Theraband;15 reps Theraband Level (Shoulder Retraction): Level 3 (Green) Row: Theraband;15 reps Theraband Level (Shoulder Row): Level 3 (Green) Protraction: Strengthening;10 reps Protraction Weight (lbs): 1 Horizontal ABduction: Strengthening Horizontal ABduction Weight (lbs): 1 External Rotation: Strengthening;10 reps;Theraband;15 reps Theraband Level (Shoulder External Rotation): Level 3 (Green) External Rotation Weight (lbs): 1 Internal Rotation: Strengthening;10 reps Theraband Level (Shoulder Internal Rotation): Level 3 (Green) Internal Rotation Weight (lbs): 1 Flexion: AROM;15 reps;Limitations Flexion Limitations: recommended visualizing a balloon floating his wrist up  for increased I with positioning and form.   Abduction: AROM;15 reps;Limitations ABduction Limitations: recommended visualizing a balloon floating his wrist up for increased I with positioning and form. ROM / Strengthening / Isometric Strengthening UBE (Upper Arm Bike): 3' and 3' 2.0 Wall Wash: 4' "W" Arms: 10x 1# X to V Arms: 10x 1# Proximal Shoulder Strengthening, Supine: x15 Ball on Wall: 2'  Manual Therapy Manual Therapy: Myofascial release Myofascial Release: MFR and manual stretching to right upper arm and scapular region to decrease pain and fascial restrictions and increase pain free mobility   Occupational Therapy Assessment and Plan OT Assessment and Plan Clinical Impression Statement: A:  Added ball on wall and resumed wall wash which both fatigued patient. OT Plan: P:  Add 1# to wall wash.   Goals Short Term Goals Time to Complete Short Term Goals: 4 weeks Short Term Goal 1: Patient will be educated on a HEP. Short Term Goal 2: Patient will increase AROM in his right shoulder by 50% for increased ability to reach behind his back and overhead. Short Term Goal 3: Patient will increase his right shoulder strength to 4/5 for increased ability to pick up pieces of wood. Short Term Goal 4: Patient will decrease pain in his right shoulder to 4/10 when reaching overhead. Short Term Goal 5: Patient will decrease fascial restrictions to minimal in his right shoulder. Long Term Goals Time to Complete Long Term Goals: 8 weeks Long Term Goal 1: Patient will return to prior level of independence with all daily activities, work, and leisure activities. Long Term Goal 2: Patient will increase AROM in his right shoulder to [facility] for increased ability to reach overhead at work. Long Term Goal 3: Patient will increase his right shoulder strength to 5/5 for increased ability to lift items at work. Long Term Goal 4: Patient will decrease pain level to 1/10 when reaching overhead. Long Term  Goal 5: Patient will decrease fascial restrictions to trace in his right shoulder region.  Problem List Patient Active Problem List  Diagnosis  . Tubular adenoma  . GERD (gastroesophageal reflux disease)  . Pain in joint, shoulder region  . Muscle weakness (generalized)  . Adhesive capsulitis of right shoulder    End of Session Activity Tolerance: Patient tolerated treatment well General Behavior During Session: [facility] for tasks performed Cognition: [facility] for tasks performed  GO    Noralee Stain, Marlina Cataldi L 02/03/2012, 11:03 AM

## 2012-02-07 ENCOUNTER — Ambulatory Visit (HOSPITAL_COMMUNITY)
Admission: RE | Admit: 2012-02-07 | Discharge: 2012-02-07 | Disposition: A | Discharge status: Home / Self Care | Payer: Managed Care, Other (non HMO) | Source: Ambulatory Visit | Attending: Orthopedic Surgery | Admitting: Orthopedic Surgery

## 2012-02-07 DIAGNOSIS — M25519 Pain in unspecified shoulder: Secondary | ICD-10-CM

## 2012-02-07 DIAGNOSIS — M7501 Adhesive capsulitis of right shoulder: Secondary | ICD-10-CM

## 2012-02-07 DIAGNOSIS — M6281 Muscle weakness (generalized): Secondary | ICD-10-CM

## 2012-02-07 NOTE — Progress Notes (Signed)
Occupational Therapy Treatment Patient Details  Name: GHALI HUFFMAN MRN: [id number] Date of Birth: Jul 12, 1960  Today's Date: 02/07/2012 Time: 4098-1191 OT Time Calculation (min): 46 min Manual Therapy [id number] 15' Therapeutic Exercise 1308-6578 30'  Visit#: 15  of 18   Re-eval: 02/11/12 Assessment Diagnosis: Right Shoulder Adhesive Capsulitis  Authorization: no authorization required   Authorization Time Period:    Authorization Visit#:   of    Subjective Symptoms/Limitations Symptoms: S:  I'm doing alright I guess, nothing new.  No pain. I am ready to get back to work. Pain Assessment Currently in Pain?: No/denies Pain Score: 0-No pain  Precautions/Restrictions     Exercise/Treatments Supine Protraction: PROM;Strengthening;15 reps Protraction Weight (lbs): 1 Horizontal ABduction: PROM;Strengthening;15 reps Horizontal ABduction Weight (lbs): 1 External Rotation: PROM;Strengthening;15 reps External Rotation Weight (lbs): 1 Internal Rotation: PROM;Strengthening;15 reps Internal Rotation Weight (lbs): 1 Flexion: PROM;Strengthening;15 reps Shoulder Flexion Weight (lbs): 1 ABduction: PROM;Strengthening;15 reps Shoulder ABduction Weight (lbs): 1 Seated Extension: Theraband;15 reps Theraband Level (Shoulder Extension): Level 3 (Green) Retraction: Theraband;15 reps Theraband Level (Shoulder Retraction): Level 3 (Green) Row: Theraband;15 reps Theraband Level (Shoulder Row): Level 3 (Green) Protraction: Strengthening;15 reps Protraction Weight (lbs): 1 Horizontal ABduction: Strengthening;15 reps Horizontal ABduction Weight (lbs): 1 External Rotation: Strengthening;Theraband;15 reps Theraband Level (Shoulder External Rotation): Level 3 (Green) External Rotation Weight (lbs): 1 Internal Rotation: Strengthening;15 reps Theraband Level (Shoulder Internal Rotation): Level 3 (Green) Internal Rotation Weight (lbs): 1 Flexion: Strengthening;15 reps Flexion Weight (lbs):  1 Abduction: Strengthening;15 reps ABduction Weight (lbs): 1 Therapy Ball Right/Left: 5 reps ROM / Strengthening / Isometric Strengthening UBE (Upper Arm Bike): 3' and 3' 3.0 Wall Wash: 2' with 1# "W" Arms: 12x 1# X to V Arms: 12x 1# Proximal Shoulder Strengthening, Supine: switch to seated Proximal Shoulder Strengthening, Seated: 15 reps each Ball on Wall: 2' flexion and abduction        Manual Therapy Manual Therapy: Myofascial release Myofascial Release: MFR and manual stretching to right upper arm and scapular region to decrease pain and fascial restrictions and increase pain free mobility   Occupational Therapy Assessment and Plan OT Assessment and Plan Clinical Impression Statement: A:  Added ball on wall in abduction, added 1# to wall wash. Both fatigued patient but no increase in pain. OT Plan: PChauncey Reading for MD appointment on Tuesday and for monthy progress note.   Goals Short Term Goals Time to Complete Short Term Goals: 4 weeks Short Term Goal 1: Patient will be educated on a HEP. Short Term Goal 1 Progress: Met Short Term Goal 2: Patient will increase AROM in his right shoulder by 50% for increased ability to reach behind his back and overhead. Short Term Goal 2 Progress: Met Short Term Goal 3: Patient will increase his right shoulder strength to 4/5 for increased ability to pick up pieces of wood. Short Term Goal 3 Progress: Met Short Term Goal 4: Patient will decrease pain in his right shoulder to 4/10 when reaching overhead. Short Term Goal 4 Progress: Met Short Term Goal 5: Patient will decrease fascial restrictions to minimal in his right shoulder. Short Term Goal 5 Progress: Met Long Term Goals Time to Complete Long Term Goals: 8 weeks Long Term Goal 1: Patient will return to prior level of independence with all daily activities, work, and leisure activities. Long Term Goal 1 Progress: Progressing toward goal Long Term Goal 2: Patient will increase AROM  in his right shoulder to [facility] for increased ability to reach overhead at work. Long Term Goal  2 Progress: Progressing toward goal Long Term Goal 3: Patient will increase his right shoulder strength to 5/5 for increased ability to lift items at work. Long Term Goal 3 Progress: Progressing toward goal Long Term Goal 4: Patient will decrease pain level to 1/10 when reaching overhead. Long Term Goal 4 Progress: Progressing toward goal Long Term Goal 5: Patient will decrease fascial restrictions to trace in his right shoulder region. Long Term Goal 5 Progress: Progressing toward goal  Problem List Patient Active Problem List  Diagnosis  . Tubular adenoma  . GERD (gastroesophageal reflux disease)  . Pain in joint, shoulder region  . Muscle weakness (generalized)  . Adhesive capsulitis of right shoulder    End of Session Activity Tolerance: Patient tolerated treatment well General Behavior During Session: [facility] for tasks performed Cognition: [facility] for tasks performed  GO    Noralee Stain, Tomeka Kantner L 02/07/2012, 12:01 PM

## 2012-02-10 ENCOUNTER — Ambulatory Visit (HOSPITAL_COMMUNITY)
Admission: RE | Admit: 2012-02-10 | Discharge: 2012-02-10 | Disposition: A | Discharge status: Home / Self Care | Payer: Managed Care, Other (non HMO) | Source: Ambulatory Visit | Attending: Orthopedic Surgery | Admitting: Orthopedic Surgery

## 2012-02-10 DIAGNOSIS — M6281 Muscle weakness (generalized): Secondary | ICD-10-CM

## 2012-02-10 DIAGNOSIS — M25519 Pain in unspecified shoulder: Secondary | ICD-10-CM

## 2012-02-10 DIAGNOSIS — M7501 Adhesive capsulitis of right shoulder: Secondary | ICD-10-CM

## 2012-02-10 NOTE — Progress Notes (Signed)
Occupational Therapy Treatment Patient Details  Name: Andrew Vargas MRN: [id number] Date of Birth: 06/03/1960  Today's Date: 02/10/2012 Time: 4098-1191 OT Time Calculation (min): 34 min Manual Therapy 4782-9562 16' Reassessment 1004-1030 26' Visit#: 16  of 18   Re-eval: 02/10/12    Authorization: no authorization required    Subjective S:  I go back to the MD tomorrow.  I think I am ready to go back to work.   Special Tests: UEFI was 76% is currently 85%.   Pain Assessment Currently in Pain?: No/denies Pain Score: 0-No pain  Precautions/Restrictions   progress as tolerated  Exercise/Treatments    Manual Therapy Manual Therapy: Myofascial release Myofascial Release: MFR and manual stretching to right upper arm and scapular region to decrease pain and fascial restrictions and increase pain free mobility   Occupational Therapy Assessment and Plan OT Assessment and Plan Clinical Impression Statement: A:  Please refer to MD progress note.  Patient is pleased with his progress and has met all short term and long term goals.  He has been educated on a HEP and is dc from skilled OT services this date.  OT Plan: P:  DC with HEP for right shoulder strengthening and shoulder stretches this date.     Goals Short Term Goals Time to Complete Short Term Goals: 4 weeks Short Term Goal 1: Patient will be educated on a HEP. Short Term Goal 1 Progress: Met Short Term Goal 2: Patient will increase AROM in his right shoulder by 50% for increased ability to reach behind his back and overhead. Short Term Goal 2 Progress: Met Short Term Goal 3: Patient will increase his right shoulder strength to 4/5 for increased ability to pick up pieces of wood. Short Term Goal 3 Progress: Met Short Term Goal 4: Patient will decrease pain in his right shoulder to 4/10 when reaching overhead. Short Term Goal 4 Progress: Met Short Term Goal 5: Patient will decrease fascial restrictions to minimal in his right  shoulder. Short Term Goal 5 Progress: Met Long Term Goals Time to Complete Long Term Goals: 8 weeks Long Term Goal 1: Patient will return to prior level of independence with all daily activities, work, and leisure activities. Long Term Goal 1 Progress: Met Long Term Goal 2: Patient will increase AROM in his right shoulder to [facility] for increased ability to reach overhead at work. Long Term Goal 2 Progress: Met Long Term Goal 3: Patient will increase his right shoulder strength to 5/5 for increased ability to lift items at work. Long Term Goal 3 Progress: Met Long Term Goal 4: Patient will decrease pain level to 1/10 when reaching overhead. Long Term Goal 4 Progress: Met Long Term Goal 5: Patient will decrease fascial restrictions to trace in his right shoulder region. Long Term Goal 5 Progress: Met  Problem List Patient Active Problem List  Diagnosis  . Tubular adenoma  . GERD (gastroesophageal reflux disease)  . Pain in joint, shoulder region  . Muscle weakness (generalized)  . Adhesive capsulitis of right shoulder    End of Session Activity Tolerance: Patient tolerated treatment well General Behavior During Session: [facility] for tasks performed Cognition: [facility] for tasks performed   Shirlean Mylar, OTR/L  02/10/2012, 11:39 AM

## 2013-04-27 ENCOUNTER — Ambulatory Visit (INDEPENDENT_AMBULATORY_CARE_PROVIDER_SITE_OTHER): Payer: BC Managed Care – PPO | Admitting: Family Medicine

## 2013-04-27 ENCOUNTER — Encounter: Payer: Self-pay | Admitting: Family Medicine

## 2013-04-27 VITALS — BP 122/80 | Ht 72.0 in | Wt 201.1 lb

## 2013-04-27 DIAGNOSIS — Z23 Encounter for immunization: Secondary | ICD-10-CM

## 2013-04-27 DIAGNOSIS — K219 Gastro-esophageal reflux disease without esophagitis: Secondary | ICD-10-CM

## 2013-04-27 DIAGNOSIS — Z0000 Encounter for general adult medical examination without abnormal findings: Secondary | ICD-10-CM

## 2013-04-27 DIAGNOSIS — N529 Male erectile dysfunction, unspecified: Secondary | ICD-10-CM

## 2013-04-27 DIAGNOSIS — D369 Benign neoplasm, unspecified site: Secondary | ICD-10-CM

## 2013-04-27 DIAGNOSIS — Z125 Encounter for screening for malignant neoplasm of prostate: Secondary | ICD-10-CM

## 2013-04-27 MED ORDER — RANITIDINE HCL 300 MG PO TABS: 300.0000 mg | ORAL_TABLET | Freq: Every day | ORAL | Status: DC

## 2013-04-27 MED ORDER — SILDENAFIL CITRATE 50 MG PO TABS: ORAL_TABLET | ORAL | Status: DC

## 2013-04-27 NOTE — Progress Notes (Signed)
   Subjective:    Patient ID: Andrew Vargas, male    DOB: 1960-04-15, 53 y.o.   MRN: [id number]  HPI Patient is here today for his annual wellness exam.   He states that he has been experiencing the urgency to urinate more than normal for several months now. Rarely gets up at night to urinate. Not much comes out  Patient has been having frequent heartburn for over a year now also. Not on any meds recently. Took some stuff before, now has come back.  using nexium otc daily, but that's pricey  Had tub adenoma on colon two yrs ago, next due in three yrs  Results for orders placed in visit on 01/16/13  HM COLONOSCOPY      Result Value Range   HM Colonoscopy [facility]    not exercising much,  Eats okay, not a lot of fried foods,  Pack of nabs or so at b fast   Eggs at bfast  No prost ca no colon ca in famly Review of Systems  Constitutional: Negative for fever, activity change and appetite change.  HENT: Negative for congestion and rhinorrhea.   Eyes: Negative for discharge.  Respiratory: Negative for cough and wheezing.   Cardiovascular: Negative for chest pain.  Gastrointestinal: Negative for vomiting, abdominal pain and blood in stool.  Genitourinary: Negative for frequency and difficulty urinating.  Musculoskeletal: Negative for neck pain.  Skin: Negative for rash.  Allergic/Immunologic: Negative for environmental allergies and food allergies.  Neurological: Negative for weakness and headaches.  Psychiatric/Behavioral: Negative for agitation.       Objective:   Physical Exam  Constitutional: He appears well-developed and well-nourished.  HENT:  Head: Normocephalic and atraumatic.  Right Ear: External ear normal.  Left Ear: External ear normal.  Nose: Nose normal.  Mouth/Throat: Oropharynx is clear and moist.  Eyes: EOM are normal. Pupils are equal, round, and reactive to light.  Neck: Normal range of motion. Neck supple. No thyromegaly present.  Cardiovascular: Normal  rate, regular rhythm and normal heart sounds.   No murmur heard. Pulmonary/Chest: Effort normal and breath sounds normal. No respiratory distress. He has no wheezes.  Abdominal: Soft. Bowel sounds are normal. He exhibits no distension and no mass. There is no tenderness.  Genitourinary: Penis normal.  Musculoskeletal: Normal range of motion. He exhibits no edema.  Lymphadenopathy:    He has no cervical adenopathy.  Neurological: He is alert. He exhibits normal muscle tone.  Skin: Skin is warm and dry. No erythema.  Psychiatric: He has a normal mood and affect. His behavior is normal. Judgment normal.          Assessment & Plan:  Impression 1 wellness exam. #2 reflux following up more. #3 prostate hypertrophy discussed. #4 erectile dysfunction ongoing. Use Viagra in the past. #5 chronic smoker plan encouraged to stop smoking. Diet exercise discussed. Appropriate blood work. Hemoccult cards. Pneumonia vaccine. WSL

## 2013-05-03 LAB — HEPATIC FUNCTION PANEL
ALBUMIN: 4 g/dL (ref 3.5–5.2)
ALT: 20 U/L (ref 0–53)
AST: 18 U/L (ref 0–37)
Alkaline Phosphatase: 61 U/L (ref 39–117)
BILIRUBIN TOTAL: 0.4 mg/dL (ref 0.2–1.2)
Bilirubin, Direct: 0.1 mg/dL (ref 0.0–0.3)
Indirect Bilirubin: 0.3 mg/dL (ref 0.2–1.2)
TOTAL PROTEIN: 6.8 g/dL (ref 6.0–8.3)

## 2013-05-03 LAB — BASIC METABOLIC PANEL
BUN: 9 mg/dL (ref 6–23)
CALCIUM: 9 mg/dL (ref 8.4–10.5)
CO2: 28 mEq/L (ref 19–32)
CREATININE: 0.89 mg/dL (ref 0.50–1.35)
Chloride: 105 mEq/L (ref 96–112)
Glucose, Bld: 88 mg/dL (ref 70–99)
Potassium: 4.3 mEq/L (ref 3.5–5.3)
SODIUM: 140 meq/L (ref 135–145)

## 2013-05-03 LAB — LIPID PANEL
CHOL/HDL RATIO: 5.1 ratio
Cholesterol: 200 mg/dL (ref 0–200)
HDL: 39 mg/dL — AB (ref >39)
LDL Cholesterol: 143 mg/dL — ABNORMAL HIGH (ref 0–99)
TRIGLYCERIDES: 91 mg/dL (ref <150)
VLDL: 18 mg/dL (ref 0–40)

## 2013-05-04 ENCOUNTER — Encounter: Payer: Self-pay | Admitting: Family Medicine

## 2013-05-04 LAB — PSA: PSA: 1.08 ng/mL (ref <=4.00)

## 2013-05-05 ENCOUNTER — Other Ambulatory Visit: Payer: Self-pay | Admitting: *Deleted

## 2013-05-05 DIAGNOSIS — Z0000 Encounter for general adult medical examination without abnormal findings: Secondary | ICD-10-CM

## 2013-05-05 LAB — POC HEMOCCULT BLD/STL (HOME/3-CARD/SCREEN)
Card #2 Fecal Occult Blod, POC: NEGATIVE
Card #3 Fecal Occult Blood, POC: NEGATIVE
FECAL OCCULT BLD: NEGATIVE

## 2014-01-19 ENCOUNTER — Encounter: Payer: Self-pay | Admitting: Family Medicine

## 2014-01-19 ENCOUNTER — Ambulatory Visit (INDEPENDENT_AMBULATORY_CARE_PROVIDER_SITE_OTHER): Payer: BC Managed Care – PPO | Admitting: Family Medicine

## 2014-01-19 VITALS — BP 112/84 | Temp 97.7°F | Ht 72.0 in | Wt 195.4 lb

## 2014-01-19 DIAGNOSIS — M67471 Ganglion, right ankle and foot: Secondary | ICD-10-CM

## 2014-01-19 NOTE — Patient Instructions (Addendum)
Ganglion cystGanglion Cyst A ganglion cyst is a noncancerous, fluid-filled lump that occurs near joints or tendons. The ganglion cyst grows out of a joint or the lining of a tendon. It most often develops in the hand or wrist but can also develop in the shoulder, elbow, hip, knee, ankle, or foot. The round or oval ganglion can be pea sized or larger than a grape. Increased activity may enlarge the size of the cyst because more fluid starts to build up.  CAUSES  It is not completely known what causes a ganglion cyst to grow. However, it may be related to:  Inflammation or irritation around the joint.  An injury.  Repetitive movements or overuse.  Arthritis. SYMPTOMS  A lump most often appears in the hand or wrist, but can occur in other areas of the body. Generally, the lump is painless without other symptoms. However, sometimes pain can be felt during activity or when pressure is applied to the lump. The lump may even be tender to the touch. Tingling, pain, numbness, or muscle weakness can occur if the ganglion cyst presses on a nerve. Your grip may be weak and you may have less movement in your joints.  DIAGNOSIS  Ganglion cysts are most often diagnosed based on a physical exam, noting where the cyst is and how it looks. Your caregiver will feel the lump and may shine a light alongside it. If it is a ganglion, a light often shines through it. Your caregiver may order an X-ray, ultrasound, or MRI to rule out other conditions. TREATMENT  Ganglions usually go away on their own without treatment. If pain or other symptoms are involved, treatment may be needed. Treatment is also needed if the ganglion limits your movement or if it gets infected. Treatment options include:  Wearing a wrist or finger brace or splint.  Taking anti-inflammatory medicine.  Draining fluid from the lump with a needle (aspiration).  Injecting a steroid into the joint.  Surgery to remove the ganglion cyst and its stalk  that is attached to the joint or tendon. However, ganglion cysts can grow back. HOME CARE INSTRUCTIONS   Do not press on the ganglion, poke it with a needle, or hit it with a heavy object. You may rub the lump gently and often. Sometimes fluid moves out of the cyst.  Only take medicines as directed by your caregiver.  Wear your brace or splint as directed by your caregiver. SEEK MEDICAL CARE IF:   Your ganglion becomes larger or more painful.  You have increased redness, red streaks, or swelling.  You have pus coming from the lump.  You have weakness or numbness in the affected area. MAKE SURE YOU:   Understand these instructions.  Will watch your condition.  Will get help right away if you are not doing well or get worse. Document Released: 03/08/2000 Document Revised: 12/04/2011 Document Reviewed: 05/05/2007 [facility] Patient Information 2015 [place], [place]. This information is not intended to replace advice given to you by your health care provider. Make sure you discuss any questions you have with your health care provider.

## 2014-01-19 NOTE — Progress Notes (Signed)
   Subjective:    Patient ID: Andrew Vargas, male    DOB: 1961-03-15, 53 y.o.   MRN: [id number]  HPI Patient is here today because he has a cyst/knot on his right foot. This has been present for about 2 months now. Patient states that it is painful. No drainage or pus noted. No treatments tried at home.    Patient states that he has no other concerns at this time.   [id number] Review of Systems No headache no chest pain no back pain    Objective:   Physical Exam Alert no apparent distress lungs clear heart regular rhythm distal right foot between great toe and second metatarsal nodule palpable with skin changes on surface.       Assessment & Plan:  Impression probable ganglion cyst at base of medial great toe discuss at length plan referral to podiatrist WS

## 2015-05-06 ENCOUNTER — Emergency Department (HOSPITAL_COMMUNITY): Payer: BLUE CROSS/BLUE SHIELD

## 2015-05-06 ENCOUNTER — Emergency Department (HOSPITAL_COMMUNITY)
Admission: EM | Admit: 2015-05-06 | Discharge: 2015-05-07 | Disposition: A | Discharge status: Home / Self Care | Payer: BLUE CROSS/BLUE SHIELD | Attending: Emergency Medicine | Admitting: Emergency Medicine

## 2015-05-06 ENCOUNTER — Encounter (HOSPITAL_COMMUNITY): Payer: Self-pay

## 2015-05-06 DIAGNOSIS — R0602 Shortness of breath: Secondary | ICD-10-CM | POA: Diagnosis not present

## 2015-05-06 DIAGNOSIS — R42 Dizziness and giddiness: Secondary | ICD-10-CM | POA: Diagnosis not present

## 2015-05-06 DIAGNOSIS — F17210 Nicotine dependence, cigarettes, uncomplicated: Secondary | ICD-10-CM | POA: Insufficient documentation

## 2015-05-06 DIAGNOSIS — R079 Chest pain, unspecified: Secondary | ICD-10-CM

## 2015-05-06 DIAGNOSIS — Z8639 Personal history of other endocrine, nutritional and metabolic disease: Secondary | ICD-10-CM | POA: Diagnosis not present

## 2015-05-06 DIAGNOSIS — N529 Male erectile dysfunction, unspecified: Secondary | ICD-10-CM | POA: Diagnosis not present

## 2015-05-06 DIAGNOSIS — Z7982 Long term (current) use of aspirin: Secondary | ICD-10-CM | POA: Insufficient documentation

## 2015-05-06 LAB — BASIC METABOLIC PANEL
ANION GAP: 8 (ref 5–15)
BUN: 10 mg/dL (ref 6–20)
CHLORIDE: 104 mmol/L (ref 101–111)
CO2: 28 mmol/L (ref 22–32)
Calcium: 8.9 mg/dL (ref 8.9–10.3)
Creatinine, Ser: 0.99 mg/dL (ref 0.61–1.24)
GFR calc Af Amer: >60 mL/min (ref >60)
GLUCOSE: 118 mg/dL — AB (ref 65–99)
POTASSIUM: 3.7 mmol/L (ref 3.5–5.1)
Sodium: 140 mmol/L (ref 135–145)

## 2015-05-06 LAB — CBC
HEMATOCRIT: 47.9 % (ref 39.0–52.0)
HEMOGLOBIN: 16.2 g/dL (ref 13.0–17.0)
MCH: 29.3 pg (ref 26.0–34.0)
MCHC: 33.8 g/dL (ref 30.0–36.0)
MCV: 86.6 fL (ref 78.0–100.0)
Platelets: 185 10*3/uL (ref 150–400)
RBC: 5.53 MIL/uL (ref 4.22–5.81)
RDW: 14.2 % (ref 11.5–15.5)
WBC: 7.5 10*3/uL (ref 4.0–10.5)

## 2015-05-06 LAB — TROPONIN I: Troponin I: <0.03 ng/mL (ref <0.031)

## 2015-05-06 NOTE — ED Notes (Signed)
EKG reviewed by Dr Wilson Singer

## 2015-05-06 NOTE — ED Notes (Signed)
MD at bedside. 

## 2015-05-06 NOTE — ED Notes (Signed)
Chest pain that started 1 week ago, dull but constant. Having nausea, shortness of breath, and dizziness.

## 2015-05-06 NOTE — ED Provider Notes (Addendum)
CSN: [id number]     Arrival date & time 05/06/15  2129 History  By signing my name below, I, Andrew Vargas, attest that this documentation has been prepared under the direction and in the presence of Sherwood Gambler, MD . Electronically Signed: Dora Vargas, Scribe. 05/06/2015. 11:44 PM.    Chief Complaint  Patient presents with  . Chest Pain     The history is provided by the patient. No language interpreter was used.     HPI Comments: Andrew Vargas is a 55 y.o. male with h/o tobacco abuse, dyspnea, and HLD who presents to the Emergency Department complaining of sudden onset, intermittent, left-sided chest pain for the last 2 weeks. He states that earlier this evening he began feeling short of breath, nauseous and dizzy right in middle of eating dinner. Pt endorses that his chest pain did not get much worse during onset of symptoms. Pt states that his symptoms lasted for around two hours but have subsided since arriving to the hospital. Pt reports that his chest currently feels slightly tight, rating it a 1/10. He reports that he felt an aching pain going through his left arm along with a tingling sensation in his left hand during the onset of his symptoms. Pt states that his chest pain is the worst when he is active at work while walking around. He states that he smokes 1 ppd. Pt has a FMHx of A-fib (mother) and both of his grandparents had heart attacks around age 98. Pt denies HTN or diabetes. He denies vomiting, weakness, swelling, or any other associated symptoms at this time. He has a PSHx of colonoscopy x3, embryonal cyst, and umbilical hernia repair.      Past Medical History  Diagnosis Date  . Tobacco abuse   . Shoulder pain   . Dyspnea   . ED (erectile dysfunction)   . Hyperlipidemia    Past Surgical History  Procedure Laterality Date  . Colonoscopy  05/02/2006    Normal rectum/Normal colon  . Colonoscopy  10/22/99    tubular adenoma  . Umbilical hernia repair      as a  baby  . Embryonal cyst      removed from throat  . Colonoscopy  11/21/2011    Procedure: COLONOSCOPY;  Surgeon: Daneil Dolin, MD;  Location: [facility];  Service: Endoscopy;  Laterality: N/A;  10:30   Family History  Problem Relation Age of Onset  . Colon cancer Neg Hx    Social History  Substance Use Topics  . Smoking status: Current Every Day Smoker -- 1.00 packs/day    Types: Cigarettes  . Smokeless tobacco: None  . Alcohol Use: Yes     Comment: socially    Review of Systems  Respiratory: Positive for chest tightness and shortness of breath.   Cardiovascular: Negative for leg swelling.  Neurological: Positive for dizziness.  All other systems reviewed and are negative.     Allergies  Review of patient's allergies indicates no known allergies.  Home Medications   Prior to Admission medications   Medication Sig Start Date End Date Taking? Authorizing Provider  aspirin EC 81 MG tablet Take 162 mg by mouth once.   Yes Historical Provider, MD  ranitidine (ZANTAC) 300 MG tablet Take 1 tablet (300 mg total) by mouth at bedtime. 04/27/13   Mikey Kirschner, MD  sildenafil (VIAGRA) 50 MG tablet Take 1 tablet 2 hours prior to sex 04/27/13   Mikey Kirschner, MD   BP  120/79 mmHg  Pulse 65  Temp(Src) 97.8 F (36.6 C) (Oral)  Resp 18  Ht 6' (1.829 m)  Wt 200 lb (90.719 kg)  BMI 27.12 kg/m2  SpO2 95% Physical Exam  Constitutional: He is oriented to person, place, and time. He appears well-developed and well-nourished.  HENT:  Head: Normocephalic and atraumatic.  Right Ear: External ear normal.  Left Ear: External ear normal.  Nose: Nose normal.  Eyes: Right eye exhibits no discharge. Left eye exhibits no discharge.  Neck: Neck supple.  Cardiovascular: Normal rate, regular rhythm, normal heart sounds and intact distal pulses.   Pulses:      Radial pulses are 2+ on the right side, and 2+ on the left side.  Pulmonary/Chest: Effort normal and breath sounds normal.   Abdominal: Soft. There is no tenderness.  Musculoskeletal: He exhibits no edema.  Neurological: He is alert and oriented to person, place, and time.  Skin: Skin is warm and dry.  Nursing note and vitals reviewed.   ED Course  Procedures (including critical care time)  DIAGNOSTIC STUDIES: Oxygen Saturation is 95% on RA, adequate by my interpretation.    COORDINATION OF CARE: 11:50 PM Will order BMP, CBC, and Troponin I. Will order CXR. Discussed treatment plan with pt at bedside and pt agreed to plan.    Labs Review Labs Reviewed  BASIC METABOLIC PANEL - Abnormal; Notable for the following:    Glucose, Bld 118 (*)    All other components within normal limits  CBC  TROPONIN I  TROPONIN I    Imaging Review Dg Chest 2 View  05/06/2015  CLINICAL DATA:  56 year old male with chest pain EXAM: CHEST  2 VIEW COMPARISON:  Radiograph dated 12/29/2005 FINDINGS: The heart size and mediastinal contours are within normal limits. Both lungs are clear. The visualized skeletal structures are unremarkable. IMPRESSION: No active cardiopulmonary disease. Electronically Signed   By: Anner Crete M.D.   On: 05/06/2015 22:29   I have personally reviewed and evaluated these images and lab results as part of my medical decision-making.   EKG Interpretation   Date/Time:  Saturday May 06 2015 21:39:54 EST Ventricular Rate:  75 PR Interval:  144 QRS Duration: 96 QT Interval:  384 QTC Calculation: 429 R Axis:   81 Text Interpretation:  Sinus rhythm no acute ST/T changes No old tracing to  compare Confirmed by Johnathyn Viscomi  MD, Dominick Morella ([id number]) on 05/06/2015 11:02:49 PM      MDM   Final diagnoses:  Chest pain, unspecified chest pain type    Patient's HEART score is a 3. He has very minimal chest pain at this time. Tonight he actually did not have any increased chest pain but only other symptoms such as shortness of breath and left arm pain. I doubt this is ACS. He has been having arm and  chest pain for quite some time but with a benign ECG and 2 negative troponins do not think further workup in the hospital and be beneficial. He does have symptoms with exertion over last 2 weeks but it is not progressive and given length of time I think an outpatient workup is adequate. Discussed with patient and family who are in agreement and do not want admission. Patient will follow-up closely with his PCP for outpatient workup. Discussed strict return precautions. Low suspicion for pulmonary embolism or dissection.  I personally performed the services described in this documentation, which was scribed in my presence. The recorded information has been reviewed and is accurate.  Sherwood Gambler, MD 05/07/15 [id number]  Sherwood Gambler, MD 05/07/15 [phone]

## 2015-05-06 NOTE — ED Notes (Signed)
Took two baby Asprin at home

## 2015-05-07 LAB — TROPONIN I: Troponin I: <0.03 ng/mL (ref <0.031)

## 2015-05-10 ENCOUNTER — Encounter: Payer: Self-pay | Admitting: Family Medicine

## 2015-05-10 ENCOUNTER — Ambulatory Visit (INDEPENDENT_AMBULATORY_CARE_PROVIDER_SITE_OTHER): Payer: BLUE CROSS/BLUE SHIELD | Admitting: Family Medicine

## 2015-05-10 VITALS — BP 142/88 | Ht 72.0 in | Wt 202.0 lb

## 2015-05-10 DIAGNOSIS — R079 Chest pain, unspecified: Secondary | ICD-10-CM | POA: Diagnosis not present

## 2015-05-10 MED ORDER — RANITIDINE HCL 300 MG PO TABS: 300.0000 mg | ORAL_TABLET | Freq: Every day | ORAL | Status: DC

## 2015-05-10 NOTE — Progress Notes (Signed)
   Subjective:    Patient ID: Andrew Vargas, male    DOB: Nov 07, 1960, 55 y.o.   MRN: [id number]  HPIFollow up ED. Seen for chest pain. Pt still having tightness in chest.   For nearly a month   Pt notes a tightness in the left ant chest, full squazzing sensation  Occurs while at work and at rest and with activity  No cough  No sig sob   Felt jittery and nauseated and dizzy  Was on zatac in the past for reflux, but got off the med  One ppd, patient continues to smoke  Not really sure whether it giets worse or not with exdrtion      Review of Systems No headache no current chest pain no abdominal pain no change in bowel habits no blood in stool    Objective:   Physical Exam  Alert no acute distress. HEENT normal. Lungs clear. Heart rare rhythm no chest wall turns abdomen benign      Assessment & Plan:  Impression atypical chest pain. Negative workup at the ER for acs, of note does have multiple risk factors discussed at length. Also history of substantial reflux in the past plan initiate empirical Zantac. Cardiology referral. Warning signs discussed. Advised to stop smoking WSL

## 2015-05-11 ENCOUNTER — Encounter: Payer: Self-pay | Admitting: Cardiology

## 2015-05-11 ENCOUNTER — Ambulatory Visit (INDEPENDENT_AMBULATORY_CARE_PROVIDER_SITE_OTHER): Payer: BLUE CROSS/BLUE SHIELD | Admitting: Cardiology

## 2015-05-11 VITALS — BP 126/84 | HR 81 | Ht 72.0 in | Wt 200.6 lb

## 2015-05-11 DIAGNOSIS — R079 Chest pain, unspecified: Secondary | ICD-10-CM | POA: Diagnosis not present

## 2015-05-11 MED ORDER — ASPIRIN EC 81 MG PO TBEC: 81.0000 mg | DELAYED_RELEASE_TABLET | Freq: Every day | ORAL | Status: DC

## 2015-05-11 NOTE — Progress Notes (Signed)
Patient ID: Andrew Vargas, male   DOB: 02-07-1961, 55 y.o.   MRN: [id number]     Clinical Summary Andrew Vargas is a 55 y.o.male seen today as a new patient for the following medical problems. He is referred by Dr Sallee Lange.   1. Chest pain - recent ER visit with chest pain - 3-4 weeks of symptoms. Pressure pain can be in different locations, including left chest and midchest. Can occur at rest or with exertion. 3-4/10 in severity. Can be positional, or worst with deep breaths. Can have some occasional palpitations, no other associated symptoms. Can last all day long constant. No relation to food. Started on zantac yesterday.  - this past Saturday had a different chest pain. While eating started feeling nauseated. Started getting SOB, dizzy and with chest pain. Stabbing pain between shoulder blades lasted just a few seconds, came to ER. BP at that time 165/91.  - denies any SOB/DOE. Tolerates yard work well, example splitting wood - reports pain popliteal fossa that resolved.  - remote history 5-10 years ago of stress test.   CAD risk factors: +tobacco x 35 years. Mother with afib. Uncle with afib. Other uncle with "heart disease". Father MI 3.   Past Medical History  Diagnosis Date  . Tobacco abuse   . Shoulder pain   . Dyspnea   . ED (erectile dysfunction)   . Hyperlipidemia      No Known Allergies   Current Outpatient Prescriptions  Medication Sig Dispense Refill  . ranitidine (ZANTAC) 300 MG tablet Take 1 tablet (300 mg total) by mouth at bedtime. 30 tablet 5   No current facility-administered medications for this visit.     Past Surgical History  Procedure Laterality Date  . Colonoscopy  05/02/2006    Normal rectum/Normal colon  . Colonoscopy  10/22/99    tubular adenoma  . Umbilical hernia repair      as a baby  . Embryonal cyst      removed from throat  . Colonoscopy  11/21/2011    Procedure: COLONOSCOPY;  Surgeon: Daneil Dolin, MD;  Location: [facility];   Service: Endoscopy;  Laterality: N/A;  10:30     No Known Allergies    Family History  Problem Relation Age of Onset  . Colon cancer Neg Hx      Social History Andrew Vargas reports that he has been smoking Cigarettes.  He has been smoking about 1.00 pack per day. He does not have any smokeless tobacco history on file. Andrew Vargas reports that he drinks alcohol.   Review of Systems CONSTITUTIONAL: No weight loss, fever, chills, weakness or fatigue.  HEENT: Eyes: No visual loss, blurred vision, double vision or yellow sclerae.No hearing loss, sneezing, congestion, runny nose or sore throat.  SKIN: No rash or itching.  CARDIOVASCULAR: per HPI RESPIRATORY: No shortness of breath, cough or sputum.  GASTROINTESTINAL: No anorexia, nausea, vomiting or diarrhea. No abdominal pain or blood.  GENITOURINARY: No burning on urination, no polyuria NEUROLOGICAL: No headache, dizziness, syncope, paralysis, ataxia, numbness or tingling in the extremities. No change in bowel or bladder control.  MUSCULOSKELETAL: No muscle, back pain, joint pain or stiffness.  LYMPHATICS: No enlarged nodes. No history of splenectomy.  PSYCHIATRIC: No history of depression or anxiety.  ENDOCRINOLOGIC: No reports of sweating, cold or heat intolerance. No polyuria or polydipsia.  Marland Kitchen   Physical Examination Filed Vitals:   05/11/15 1243  BP: 126/84  Pulse: 81   Filed Weights  05/11/15 1243  Weight: 200 lb 9.6 oz (90.992 kg)    Gen: resting comfortably, no acute distress HEENT: no scleral icterus, pupils equal round and reactive, no palptable cervical adenopathy,  CV Resp: Clear to auscultation bilaterally GI: abdomen is soft, non-tender, non-distended, normal bowel sounds, no hepatosplenomegaly MSK: extremities are warm, no edema.  Skin: warm, no rash Neuro:  no focal deficits Psych: appropriate affect   Diagnostic Studies Labs, ekg, and imaging reviewed in clinic    Assessment and Plan   1. Chest  pain - previous EKG NSR, no ischemic changes - unclear etiology, symptoms somewhat mixed for possible cardiac etiology - he does have some CAD risk factors. We will obtain a GXT to further evaluate   F/u pending stress results.      Arnoldo Lenis, M.D.

## 2015-05-11 NOTE — Patient Instructions (Signed)
Your physician recommends that you schedule a follow-up appointment will depend on your test results.  Your physician has recommended you make the following change in your medication:   Start Aspirin 81 mg Daily  Your physician has requested that you have an exercise tolerance test. For further information please visit [email or web address]. Please also follow instruction sheet, as given.  Thank you for choosing [facility]!

## 2015-05-18 ENCOUNTER — Ambulatory Visit (HOSPITAL_COMMUNITY)
Admission: RE | Admit: 2015-05-18 | Discharge: 2015-05-18 | Disposition: A | Discharge status: Home / Self Care | Payer: BLUE CROSS/BLUE SHIELD | Source: Ambulatory Visit | Attending: Cardiology | Admitting: Cardiology

## 2015-05-18 DIAGNOSIS — R079 Chest pain, unspecified: Secondary | ICD-10-CM | POA: Insufficient documentation

## 2015-05-18 LAB — EXERCISE TOLERANCE TEST
CHL CUP RESTING HR STRESS: 69 {beats}/min
CHL RATE OF PERCEIVED EXERTION: 15
CSEPED: 10 min
Estimated workload: 13.4 METS
Exercise duration (sec): 6 s
MPHR: 167 {beats}/min
Peak HR: 151 {beats}/min
Percent HR: 90 %

## 2016-01-09 ENCOUNTER — Ambulatory Visit (INDEPENDENT_AMBULATORY_CARE_PROVIDER_SITE_OTHER): Payer: BLUE CROSS/BLUE SHIELD | Admitting: Family Medicine

## 2016-01-09 ENCOUNTER — Encounter: Payer: Self-pay | Admitting: Family Medicine

## 2016-01-09 VITALS — BP 128/80 | Ht 72.0 in | Wt 198.2 lb

## 2016-01-09 DIAGNOSIS — Z13220 Encounter for screening for lipoid disorders: Secondary | ICD-10-CM

## 2016-01-09 DIAGNOSIS — F321 Major depressive disorder, single episode, moderate: Secondary | ICD-10-CM | POA: Diagnosis not present

## 2016-01-09 DIAGNOSIS — R5383 Other fatigue: Secondary | ICD-10-CM

## 2016-01-09 DIAGNOSIS — Z125 Encounter for screening for malignant neoplasm of prostate: Secondary | ICD-10-CM

## 2016-01-09 MED ORDER — BUPROPION HCL ER (SR) 150 MG PO TB12: ORAL_TABLET | ORAL | 3 refills | Status: DC

## 2016-01-09 NOTE — Progress Notes (Signed)
   Subjective:    Patient ID: Andrew Vargas, male    DOB: April 28, 1960, 55 y.o.   MRN: [id number]  Depression         This is a chronic problem.  Episode onset: at least one year.   Associated symptoms include decreased concentration, fatigue, insomnia and decreased interest.  Associated symptoms include no suicidal ideas.  Past treatments include nothing.   Having trouble sleeping energy level poor  Hx of irritation and moodiness  Worse lately  Not working full time  whats the use feelings  Works at [employer] covering and   No thyroid disease  Gets no execise these days   Relationship not good with wife.  Heart burn reflux stable           Review of Systems  Constitutional: Positive for fatigue.  Psychiatric/Behavioral: Positive for decreased concentration and depression. Negative for suicidal ideas. The patient has insomnia.        Objective:   Physical Exam  Alert vitals stable, NAD. Blood pressure good on repeat. HEENT normal. Lungs clear. Heart regular rate and rhythm. Affect sad at times tearful at times. States definitely 0 suicidal or homicidal thoughts.      Assessment & Plan:  Impression depression discussed developing in recent years. Patient has had element of this for some time. Long discussion held regarding the nature of this. Is having challenges with his marriage but reluctant to participate in counseling at this time. Plan initiate Wellbutrin. Rationale discussed. Exercise strongly encourage. Recheck as scheduled. Side effects benefits discussed. 35-40 minutes spent most in discussion educational information given. Appropriate blood work recommended and discuss.

## 2016-01-09 NOTE — Patient Instructions (Signed)
Major Depressive Disorder Major depressive disorder is a mental illness. It also may be called clinical depression or unipolar depression. Major depressive disorder usually causes feelings of sadness, hopelessness, or helplessness. Some people with this disorder do not feel particularly sad but lose interest in doing things they used to enjoy (anhedonia). Major depressive disorder also can cause physical symptoms. It can interfere with work, school, relationships, and other normal everyday activities. The disorder varies in severity but is longer lasting and more serious than the sadness we all feel from time to time in our lives. Major depressive disorder often is triggered by stressful life events or major life changes. Examples of these triggers include divorce, loss of your job or home, a move, and the death of a family member or close friend. Sometimes this disorder occurs for no obvious reason at all. People who have family members with major depressive disorder or bipolar disorder are at higher risk for developing this disorder, with or without life stressors. Major depressive disorder can occur at any age. It may occur just once in your life (single episode major depressive disorder). It may occur multiple times (recurrent major depressive disorder). SYMPTOMS People with major depressive disorder have either anhedonia or depressed mood on nearly a daily basis for at least 2 weeks or longer. Symptoms of depressed mood include:  Feelings of sadness (blue or down in the dumps) or emptiness.  Feelings of hopelessness or helplessness.  Tearfulness or episodes of crying (may be observed by others).  Irritability (children and adolescents). In addition to depressed mood or anhedonia or both, people with this disorder have at least four of the following symptoms:  Difficulty sleeping or sleeping too much.   Significant change (increase or decrease) in appetite or weight.   Lack of energy or  motivation.  Feelings of guilt and worthlessness.   Difficulty concentrating, remembering, or making decisions.  Unusually slow movement (psychomotor retardation) or restlessness (as observed by others).   Recurrent wishes for death, recurrent thoughts of self-harm (suicide), or a suicide attempt. People with major depressive disorder commonly have persistent negative thoughts about themselves, other people, and the world. People with severe major depressive disorder may experiencedistorted beliefs or perceptions about the world (psychotic delusions). They also may see or hear things that are not real (psychotic hallucinations). DIAGNOSIS Major depressive disorder is diagnosed through an assessment by your health care provider. Your health care provider will ask aboutaspects of your daily life, such as mood,sleep, and appetite, to see if you have the diagnostic symptoms of major depressive disorder. Your health care provider may ask about your medical history and use of alcohol or drugs, including prescription medicines. Your health care provider also may do a physical exam and blood work. This is because certain medical conditions and the use of certain substances can cause major depressive disorder-like symptoms (secondary depression). Your health care provider also may refer you to a mental health specialist for further evaluation and treatment. TREATMENT It is important to recognize the symptoms of major depressive disorder and seek treatment. The following treatments can be prescribed for this disorder:   Medicine. Antidepressant medicines usually are prescribed. Antidepressant medicines are thought to correct chemical imbalances in the brain that are commonly associated with major depressive disorder. Other types of medicine may be added if the symptoms do not respond to antidepressant medicines alone or if psychotic delusions or hallucinations occur.  Talk therapy. Talk therapy can be  helpful in treating major depressive disorder by providing   support, education, and guidance. Certain types of talk therapy also can help with negative thinking (cognitive behavioral therapy) and with relationship issues that trigger this disorder (interpersonal therapy). A mental health specialist can help determine which treatment is best for you. Most people with major depressive disorder do well with a combination of medicine and talk therapy. Treatments involving electrical stimulation of the brain can be used in situations with extremely severe symptoms or when medicine and talk therapy do not work over time. These treatments include electroconvulsive therapy, transcranial magnetic stimulation, and vagal nerve stimulation.   This information is not intended to replace advice given to you by your health care provider. Make sure you discuss any questions you have with your health care provider.   Document Released: 07/06/2012 Document Revised: 04/01/2014 Document Reviewed: 07/06/2012 Elsevier Interactive Patient Education 2016 Elsevier Inc.  

## 2016-01-16 LAB — HEPATIC FUNCTION PANEL
ALK PHOS: 70 IU/L (ref 39–117)
ALT: 20 IU/L (ref 0–44)
AST: 19 IU/L (ref 0–40)
Albumin: 4.3 g/dL (ref 3.5–5.5)
BILIRUBIN TOTAL: 0.3 mg/dL (ref 0.0–1.2)
Bilirubin, Direct: 0.1 mg/dL (ref 0.00–0.40)
Total Protein: 7.2 g/dL (ref 6.0–8.5)

## 2016-01-16 LAB — BASIC METABOLIC PANEL
BUN/Creatinine Ratio: 10 (ref 9–20)
BUN: 10 mg/dL (ref 6–24)
CALCIUM: 9.3 mg/dL (ref 8.7–10.2)
CHLORIDE: 105 mmol/L (ref 96–106)
CO2: 24 mmol/L (ref 18–29)
Creatinine, Ser: 0.96 mg/dL (ref 0.76–1.27)
GFR, EST AFRICAN AMERICAN: 103 mL/min/{1.73_m2} (ref >59)
GFR, EST NON AFRICAN AMERICAN: 89 mL/min/{1.73_m2} (ref >59)
Glucose: 93 mg/dL (ref 65–99)
POTASSIUM: 4.3 mmol/L (ref 3.5–5.2)
SODIUM: 141 mmol/L (ref 134–144)

## 2016-01-16 LAB — LIPID PANEL
CHOLESTEROL TOTAL: 208 mg/dL — AB (ref 100–199)
Chol/HDL Ratio: 4.5 ratio units (ref 0.0–5.0)
HDL: 46 mg/dL (ref >39)
LDL CALC: 143 mg/dL — AB (ref 0–99)
TRIGLYCERIDES: 93 mg/dL (ref 0–149)
VLDL CHOLESTEROL CAL: 19 mg/dL (ref 5–40)

## 2016-01-16 LAB — TSH: TSH: 1.73 u[IU]/mL (ref 0.450–4.500)

## 2016-01-16 LAB — PSA: PROSTATE SPECIFIC AG, SERUM: 0.9 ng/mL (ref 0.0–4.0)

## 2016-01-22 ENCOUNTER — Encounter: Payer: Self-pay | Admitting: Family Medicine

## 2016-02-09 ENCOUNTER — Ambulatory Visit (INDEPENDENT_AMBULATORY_CARE_PROVIDER_SITE_OTHER): Payer: BLUE CROSS/BLUE SHIELD | Admitting: Family Medicine

## 2016-02-09 ENCOUNTER — Encounter: Payer: Self-pay | Admitting: Family Medicine

## 2016-02-09 VITALS — BP 120/80 | Ht 72.0 in | Wt 198.0 lb

## 2016-02-09 DIAGNOSIS — F321 Major depressive disorder, single episode, moderate: Secondary | ICD-10-CM

## 2016-02-09 NOTE — Progress Notes (Signed)
   Subjective:    Patient ID: Andrew Vargas, male    DOB: July 15, 1960, 55 y.o.   MRN: [id number]  HPI Patient arrives for a follow up on depression. Patient states he is doing pretty good on Wellbutrin.  Exercise walking daily  No s e's to wellbutrin other than maybe insomnia  Trouble sleeping , but beter f stay s up..  Patient reports interacting with his wife in a more functional way now. Also his daughter. Feels they're talking better. Less difficulty with feeling down.  No suicidal thoughts no homicidal thoughts.  No noticeable side effects from medication.  Still bit of trouble sleeping.  Work overall challenging but managing for now  Patient goes into great detail all of his challenges with his family currently. Feeling that his wife and daughter gangs up on   Better I ,  Review of Systems No headache, no major weight loss or weight gain, no chest pain no back pain abdominal pain no change in bowel habits complete ROS otherwise negative     Objective:   Physical Exam  Alert vitals stable, NAD. Blood pressure good on repeat. HEENT normal. Lungs clear. Heart regular rate and rhythm.       Assessment & Plan:  Impression 1 depression clinically stable long discussion held. Patient wishes to maintain current medications. Advised him to continue to work on improving communication with his wife. Exercise strongly encourage. Long-term management discussed. 25 minutes spent most in discussion Plan follow-up in several months WSL

## 2016-05-09 ENCOUNTER — Other Ambulatory Visit: Payer: Self-pay | Admitting: Family Medicine

## 2016-05-29 ENCOUNTER — Ambulatory Visit (INDEPENDENT_AMBULATORY_CARE_PROVIDER_SITE_OTHER): Payer: BLUE CROSS/BLUE SHIELD | Admitting: Podiatry

## 2016-05-29 ENCOUNTER — Ambulatory Visit (INDEPENDENT_AMBULATORY_CARE_PROVIDER_SITE_OTHER): Payer: BLUE CROSS/BLUE SHIELD

## 2016-05-29 DIAGNOSIS — M722 Plantar fascial fibromatosis: Secondary | ICD-10-CM

## 2016-05-29 DIAGNOSIS — M79673 Pain in unspecified foot: Secondary | ICD-10-CM

## 2016-05-29 DIAGNOSIS — M79672 Pain in left foot: Secondary | ICD-10-CM | POA: Diagnosis not present

## 2016-05-29 DIAGNOSIS — M79671 Pain in right foot: Secondary | ICD-10-CM

## 2016-06-04 NOTE — Progress Notes (Signed)
   Subjective: Patient presents today as a new patient for evaluation of bilateral heel pain. Patient is having this heel pain for approximately 2 years now. He has been 2 other podiatrists and has spent a significant amount of money on orthotics, injections, night splints, and bracing that do not help. Patient has very limited relief. Patient presents today for a second opinion further treatment and evaluation.  Objective: Physical Exam General: The patient is alert and oriented x3 in no acute distress.  Dermatology: Skin is warm, dry and supple bilateral lower extremities. Negative for open lesions or macerations bilateral.   Vascular: Dorsalis Pedis and Posterior Tibial pulses palpable bilateral.  Capillary fill time is immediate to all digits.  Neurological: Epicritic and protective threshold intact bilateral.   Musculoskeletal: Tenderness to palpation at the medial calcaneal tubercale and through the insertion of the plantar fascia of the bilateral feet. All other joints range of motion within normal limits bilateral. Strength 5/5 in all groups bilateral.   Radiographic exam: Normal osseous mineralization. Joint spaces preserved. No fracture/dislocation/boney destruction. Calcaneal spur present with mild thickening of plantar fascia bilateral. No other soft tissue abnormalities or radiopaque foreign bodies.   Assessment: #1 plantar fasciitis bilateral feet #2 pain in bilateral feet  Plan of Care:  1. Patient evaluated. Xrays reviewed.   2. Today we're going to initiate EPAT shockwave therapy. Patient has exhausted all other conservative modalities. 3. Continue custom molded orthotics and all other conservative modalities. 4. Return to clinic after treatment modalities of shockwave for reevaluation.  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 Wife is a Marine scientist as [employer].   Edrick Kins, DPM [facility]  Dr. Edrick Kins, [address]                                        [place], [place] [address]                Office [phone]  Fax [phone]

## 2016-06-05 ENCOUNTER — Ambulatory Visit: Payer: BLUE CROSS/BLUE SHIELD | Admitting: Family Medicine

## 2016-06-05 ENCOUNTER — Ambulatory Visit (INDEPENDENT_AMBULATORY_CARE_PROVIDER_SITE_OTHER): Payer: BLUE CROSS/BLUE SHIELD | Admitting: Family Medicine

## 2016-06-05 ENCOUNTER — Encounter: Payer: Self-pay | Admitting: Family Medicine

## 2016-06-05 VITALS — BP 134/88 | Ht 72.0 in | Wt 199.0 lb

## 2016-06-05 DIAGNOSIS — F325 Major depressive disorder, single episode, in full remission: Secondary | ICD-10-CM | POA: Diagnosis not present

## 2016-06-05 MED ORDER — BUPROPION HCL ER (SR) 150 MG PO TB12: ORAL_TABLET | ORAL | 2 refills | Status: DC

## 2016-06-05 NOTE — Patient Instructions (Signed)
Mid April change to one tab each morning on the wellbutrin for two weeks, then every othe morn for two weeks, then stop, notify us if any problems

## 2016-06-05 NOTE — Progress Notes (Signed)
   Subjective:    Patient ID: Andrew Vargas, male    DOB: 1960/10/28, 56 y.o.   MRN: [id number]  Depression         This is a chronic problem.  Compliance with treatment is good. pt states no concerns or problems today.   Stays physically active   Patient notes ongoing compliance with antidepressant medication. No obvious side effects. Reports does not miss a dose. Overall continues to help depression substantially. No thoughts of homicide or suicide. Would like to maintain medication. Several times  Patient notes ongoing compliance with antidepressant medication. No obvious side effects. Reports does not miss a dose. Overall continues to help depression substantially. No thoughts of homicide or suicide. Would like to maintain medication.     Review of Systems  Psychiatric/Behavioral: Positive for depression.   Mild headaches    Objective:   Physical Exam  Alert vitals stable, NAD. Blood pressure good on repeat. HEENT normal. Lungs clear. Heart regular rate and rhythm.       Assessment & Plan:  Impression depression now stabilized. Patient doing quite a bit better. Would like to attempt weaning medicines this spring. Warning signs discussed parameters discussed. We'll try to do this

## 2016-06-11 ENCOUNTER — Ambulatory Visit: Payer: BLUE CROSS/BLUE SHIELD

## 2016-06-11 DIAGNOSIS — M722 Plantar fascial fibromatosis: Secondary | ICD-10-CM

## 2016-06-11 DIAGNOSIS — R52 Pain, unspecified: Secondary | ICD-10-CM

## 2016-06-18 ENCOUNTER — Ambulatory Visit (INDEPENDENT_AMBULATORY_CARE_PROVIDER_SITE_OTHER): Payer: BLUE CROSS/BLUE SHIELD

## 2016-06-18 DIAGNOSIS — M722 Plantar fascial fibromatosis: Secondary | ICD-10-CM

## 2016-06-25 ENCOUNTER — Ambulatory Visit: Payer: BLUE CROSS/BLUE SHIELD

## 2016-06-25 DIAGNOSIS — M722 Plantar fascial fibromatosis: Secondary | ICD-10-CM

## 2016-06-26 NOTE — Progress Notes (Signed)
Pt presents with on going bilateral plantar fascial pain in both heels for which he has tried various treatments with no success.   All other systems are negative   Pain on palpation of bilateral medial heel bands. 8 of 10   ESWT administered to bilateral heels Rt 13 joules, Lt 10 joules. EPAT delivered to surrounding connective tissues for 3000 pulses and tolerated will. Re-appointed in 1 week for 2nd treatment

## 2016-06-26 NOTE — Progress Notes (Signed)
Pt presents with on going bilateral plantar fascial pain in both heels for which he has tried various treatments with no success.   All other systems are negative   Pain on palpation of bilateral medial heel bands. 8 of 10   ESWT administered to bilateral heels Rt 13 joules, Lt 15 joules. EPAT delivered to surrounding connective tissues for 3000 pulses and tolerated will. Re-appointed in 1 week for 3rd treatment

## 2016-06-27 NOTE — Progress Notes (Signed)
Pt presents with on going bilateral plantar fascial pain in both heels for which he has tried various treatments with no success.   All other systems are negative   Pain on palpation of bilateral medial heel bands. 8 of 10   ESWT administered to bilateral heels Rt 15 joules, Lt 15 joules. EPAT delivered to surrounding connective tissues for 3000 pulses and tolerated will. Re-appointed in 4 weeks for 4th treatment

## 2016-07-26 ENCOUNTER — Ambulatory Visit (INDEPENDENT_AMBULATORY_CARE_PROVIDER_SITE_OTHER): Payer: Self-pay | Admitting: Podiatry

## 2016-07-26 DIAGNOSIS — M722 Plantar fascial fibromatosis: Secondary | ICD-10-CM

## 2016-07-26 NOTE — Progress Notes (Signed)
Pt presents with on going bilateral plantar fascial pain in both heels for which he has tried various treatments with no success. He states that his right foot feel much better and does not cause him pain. His Lt foot hurts but he states it has improved about 70%  All other systems are negative   Pain on palpation of bilateral medial heel bands. 8 of 10   ESWT administered to bilateral heels Rt 15 joules, Lt 15 joules. EPAT delivered to surrounding connective tissues for 3000 pulses and tolerated will. Re-appointed in 6 weeks if needed

## 2016-09-02 ENCOUNTER — Encounter: Payer: Self-pay | Admitting: Podiatry

## 2016-09-02 ENCOUNTER — Ambulatory Visit (INDEPENDENT_AMBULATORY_CARE_PROVIDER_SITE_OTHER): Payer: BLUE CROSS/BLUE SHIELD | Admitting: Podiatry

## 2016-09-02 DIAGNOSIS — M722 Plantar fascial fibromatosis: Secondary | ICD-10-CM

## 2016-09-02 MED ORDER — GABAPENTIN 100 MG PO CAPS: 100.0000 mg | ORAL_CAPSULE | Freq: Three times a day (TID) | ORAL | 3 refills | Status: DC

## 2016-09-02 MED ORDER — TRAMADOL HCL 50 MG PO TABS: 50.0000 mg | ORAL_TABLET | Freq: Three times a day (TID) | ORAL | 0 refills | Status: DC | PRN

## 2016-09-11 NOTE — Progress Notes (Signed)
   Subjective: Patient is a 56 year old male presenting today for follow-up evaluation of bilateral plantar fasciitis. He states the pain of the right foot has improved but he is still having intermittent pain in the left foot. He has no new complaints today. He is here for further evaluation and treatment.  Objective: Physical Exam General: The patient is alert and oriented x3 in no acute distress.  Dermatology: Skin is warm, dry and supple bilateral lower extremities. Negative for open lesions or macerations bilateral.   Vascular: Dorsalis Pedis and Posterior Tibial pulses palpable bilateral.  Capillary fill time is immediate to all digits.  Neurological: Epicritic and protective threshold intact bilateral.   Musculoskeletal: Tenderness to palpation at the medial calcaneal tubercale and through the insertion of the plantar fascia of the bilateral feet. All other joints range of motion within normal limits bilateral. Strength 5/5 in all groups bilateral.    Assessment: #1 plantar fasciitis bilateral feet #2 pain in bilateral feet #3 neuritis left heel  Plan of Care:  1. Patient evaluated.    2. Prescription for tramadol 50 mg #60 given to patient. 3. Prescription for gabapentin 100 mg 3 times a day #30 given to patient. 4. Return to clinic in 6 weeks.  Going to [place] for 1 month.  Edrick Kins, DPM [facility]  Dr. Edrick Kins, [address]                                        [place], [place] [address]                Office [phone]  Fax [phone]

## 2016-10-21 ENCOUNTER — Ambulatory Visit: Payer: BLUE CROSS/BLUE SHIELD | Admitting: Podiatry

## 2016-11-07 ENCOUNTER — Encounter: Payer: Self-pay | Admitting: Internal Medicine

## 2017-06-02 ENCOUNTER — Other Ambulatory Visit: Payer: Self-pay | Admitting: Family Medicine

## 2017-06-02 ENCOUNTER — Telehealth: Payer: Self-pay | Admitting: Family Medicine

## 2017-06-02 DIAGNOSIS — Z125 Encounter for screening for malignant neoplasm of prostate: Secondary | ICD-10-CM

## 2017-06-02 DIAGNOSIS — Z0000 Encounter for general adult medical examination without abnormal findings: Secondary | ICD-10-CM

## 2017-06-02 NOTE — Telephone Encounter (Signed)
Patient has wellness on 3/27 and needing labs done.

## 2017-06-02 NOTE — Telephone Encounter (Signed)
Lab orders placed in Epic; spoke with spouse and let her know that orders were placed and pt may go anytime to have labs drawn.

## 2017-06-02 NOTE — Telephone Encounter (Signed)
Last labs BMP Hepatic Lipid PSA TSH. Please advise

## 2017-06-02 NOTE — Telephone Encounter (Signed)
Rep same except tsh

## 2017-06-09 DIAGNOSIS — Z0000 Encounter for general adult medical examination without abnormal findings: Secondary | ICD-10-CM | POA: Diagnosis not present

## 2017-06-09 DIAGNOSIS — Z125 Encounter for screening for malignant neoplasm of prostate: Secondary | ICD-10-CM | POA: Diagnosis not present

## 2017-06-10 LAB — LIPID PANEL
CHOLESTEROL TOTAL: 214 mg/dL — AB (ref 100–199)
Chol/HDL Ratio: 4.7 ratio (ref 0.0–5.0)
HDL: 46 mg/dL (ref >39)
LDL Calculated: 146 mg/dL — ABNORMAL HIGH (ref 0–99)
Triglycerides: 109 mg/dL (ref 0–149)
VLDL Cholesterol Cal: 22 mg/dL (ref 5–40)

## 2017-06-10 LAB — BASIC METABOLIC PANEL
BUN / CREAT RATIO: 11 (ref 9–20)
BUN: 10 mg/dL (ref 6–24)
CO2: 25 mmol/L (ref 20–29)
Calcium: 9.4 mg/dL (ref 8.7–10.2)
Chloride: 102 mmol/L (ref 96–106)
Creatinine, Ser: 0.92 mg/dL (ref 0.76–1.27)
GFR calc non Af Amer: 93 mL/min/{1.73_m2} (ref >59)
GFR, EST AFRICAN AMERICAN: 107 mL/min/{1.73_m2} (ref >59)
GLUCOSE: 112 mg/dL — AB (ref 65–99)
Potassium: 4.7 mmol/L (ref 3.5–5.2)
SODIUM: 140 mmol/L (ref 134–144)

## 2017-06-10 LAB — HEPATIC FUNCTION PANEL
ALK PHOS: 72 IU/L (ref 39–117)
ALT: 22 IU/L (ref 0–44)
AST: 19 IU/L (ref 0–40)
Albumin: 4.2 g/dL (ref 3.5–5.5)
BILIRUBIN TOTAL: 0.3 mg/dL (ref 0.0–1.2)
BILIRUBIN, DIRECT: 0.07 mg/dL (ref 0.00–0.40)
Total Protein: 7 g/dL (ref 6.0–8.5)

## 2017-06-10 LAB — PSA: PROSTATE SPECIFIC AG, SERUM: 1.7 ng/mL (ref 0.0–4.0)

## 2017-06-18 ENCOUNTER — Encounter: Payer: Self-pay | Admitting: Family Medicine

## 2017-06-18 ENCOUNTER — Ambulatory Visit (INDEPENDENT_AMBULATORY_CARE_PROVIDER_SITE_OTHER): Payer: Commercial Managed Care - PPO | Admitting: Family Medicine

## 2017-06-18 VITALS — BP 132/84 | Ht 72.0 in | Wt 204.4 lb

## 2017-06-18 DIAGNOSIS — R7303 Prediabetes: Secondary | ICD-10-CM | POA: Insufficient documentation

## 2017-06-18 DIAGNOSIS — Z0000 Encounter for general adult medical examination without abnormal findings: Secondary | ICD-10-CM | POA: Diagnosis not present

## 2017-06-18 MED ORDER — VARENICLINE TARTRATE 0.5 MG X 11 & 1 MG X 42 PO MISC: ORAL | 0 refills | Status: DC

## 2017-06-18 MED ORDER — VARENICLINE TARTRATE 1 MG PO TABS: 1.0000 mg | ORAL_TABLET | Freq: Two times a day (BID) | ORAL | 2 refills | Status: DC

## 2017-06-18 NOTE — Progress Notes (Signed)
Subjective:    Patient ID: Andrew Vargas, male    DOB: [birth year], 57 y.o.   MRN: [id number]  HPI The patient comes in today for a wellness visit.    A review of their health history was completed.  A review of medications was also completed.  Any needed refills; none  Eating habits: eating healthy  Falls/  MVA accidents in past few months: none  Regular exercise: not really  Specialist pt sees on regular basis: no  Preventative health issues were discussed.   Additional concerns: no  Results for orders placed or performed in visit on 06/02/17  PSA  Result Value Ref Range   Prostate Specific Ag, Serum 1.7 0.0 - 4.0 ng/mL  Lipid Profile  Result Value Ref Range   Cholesterol, Total 214 (H) 100 - 199 mg/dL   Triglycerides 109 0 - 149 mg/dL   HDL 46 >39 mg/dL   VLDL Cholesterol Cal 22 5 - 40 mg/dL   LDL Calculated 146 (H) 0 - 99 mg/dL   Chol/HDL Ratio 4.7 0.0 - 5.0 ratio  Hepatic function panel  Result Value Ref Range   Total Protein 7.0 6.0 - 8.5 g/dL   Albumin 4.2 3.5 - 5.5 g/dL   Bilirubin Total 0.3 0.0 - 1.2 mg/dL   Bilirubin, Direct 0.07 0.00 - 0.40 mg/dL   Alkaline Phosphatase 72 39 - 117 IU/L   AST 19 0 - 40 IU/L   ALT 22 0 - 44 IU/L  Basic Metabolic Panel (BMET)  Result Value Ref Range   Glucose 112 (H) 65 - 99 mg/dL   BUN 10 6 - 24 mg/dL   Creatinine, Ser 0.92 0.76 - 1.27 mg/dL   GFR calc non Af Amer 93 >59 mL/min/1.73   GFR calc Af Amer 107 >59 mL/min/1.73   BUN/Creatinine Ratio 11 9 - 20   Sodium 140 134 - 144 mmol/L   Potassium 4.7 3.5 - 5.2 mmol/L   Chloride 102 96 - 106 mmol/L   CO2 25 20 - 29 mmol/L   Calcium 9.4 8.7 - 10.2 mg/dL   Prediabetes dusc, fa hs prediabetes. No one took med   Diet is so so   Not exrcisin g much during work  Days  Tries to stay active in th [place]    Hx of depr and anxiety no w improved, less stress, doing better,    Review of Systems  Constitutional: Negative for activity change, appetite change and  fever.  HENT: Negative for congestion and rhinorrhea.   Eyes: Negative for discharge.  Respiratory: Negative for cough and wheezing.   Cardiovascular: Negative for chest pain.  Gastrointestinal: Negative for abdominal pain, blood in stool and vomiting.  Genitourinary: Negative for difficulty urinating and frequency.  Musculoskeletal: Negative for neck pain.  Skin: Negative for rash.  Allergic/Immunologic: Negative for environmental allergies and food allergies.  Neurological: Negative for weakness and headaches.  Psychiatric/Behavioral: Negative for agitation.  All other systems reviewed and are negative.      Objective:   Physical Exam  Constitutional: He appears well-developed and well-nourished.  HENT:  Head: Normocephalic and atraumatic.  Right Ear: External ear normal.  Left Ear: External ear normal.  Nose: Nose normal.  Mouth/Throat: Oropharynx is clear and moist.  Eyes: Right eye exhibits no discharge. Left eye exhibits no discharge. No scleral icterus.  Neck: Normal range of motion. Neck supple. No thyromegaly present.  Cardiovascular: Normal rate, regular rhythm and normal heart sounds.  No murmur heard. Pulmonary/Chest: Effort  normal and breath sounds normal. No respiratory distress. He has no wheezes.  Abdominal: Soft. Bowel sounds are normal. He exhibits no distension and no mass. There is no tenderness.  Genitourinary: Penis normal.  Musculoskeletal: Normal range of motion. He exhibits no edema.  Lymphadenopathy:    He has no cervical adenopathy.  Neurological: He is alert. He exhibits normal muscle tone. Coordination normal.  Skin: Skin is warm and dry. No erythema.  Psychiatric: He has a normal mood and affect. His behavior is normal. Judgment normal.  Vitals reviewed.         Assessment & Plan:  Colonoscopy due last one 2013, tubular adenoma, Pt has not responced yet  1 impression wellness exam.  Patient overdue for colonoscopy.  He will call Dr. work.   Diet discussed.  Exercise discussed.  Mentally patient is in a better place now with less stress at work.  Smoking cessation discussed.  Chantix prescribed  2.  Prediabetes.  Educated and discussed  Follow-up yearly diet exercise discussed.

## 2017-06-18 NOTE — Patient Instructions (Addendum)
Results for orders placed or performed in visit on 06/02/17  PSA  Result Value Ref Range   Prostate Specific Ag, Serum 1.7 0.0 - 4.0 ng/mL  Lipid Profile  Result Value Ref Range   Cholesterol, Total 214 (H) 100 - 199 mg/dL   Triglycerides 109 0 - 149 mg/dL   HDL 46 >39 mg/dL   VLDL Cholesterol Cal 22 5 - 40 mg/dL   LDL Calculated 146 (H) 0 - 99 mg/dL   Chol/HDL Ratio 4.7 0.0 - 5.0 ratio  Hepatic function panel  Result Value Ref Range   Total Protein 7.0 6.0 - 8.5 g/dL   Albumin 4.2 3.5 - 5.5 g/dL   Bilirubin Total 0.3 0.0 - 1.2 mg/dL   Bilirubin, Direct 0.07 0.00 - 0.40 mg/dL   Alkaline Phosphatase 72 39 - 117 IU/L   AST 19 0 - 40 IU/L   ALT 22 0 - 44 IU/L  Basic Metabolic Panel (BMET)  Result Value Ref Range   Glucose 112 (H) 65 - 99 mg/dL   BUN 10 6 - 24 mg/dL   Creatinine, Ser 0.92 0.76 - 1.27 mg/dL   GFR calc non Af Amer 93 >59 mL/min/1.73   GFR calc Af Amer 107 >59 mL/min/1.73   BUN/Creatinine Ratio 11 9 - 20   Sodium 140 134 - 144 mmol/L   Potassium 4.7 3.5 - 5.2 mmol/L   Chloride 102 96 - 106 mmol/L   CO2 25 20 - 29 mmol/L   Calcium 9.4 8.7 - 10.2 mg/dL     Prediabetes Eating Plan Prediabetes-also called impaired glucose tolerance or impaired fasting glucose-is a condition that causes blood sugar (blood glucose) levels to be higher than normal. Following a healthy diet can help to keep prediabetes under control. It can also help to lower the risk of type 2 diabetes and heart disease, which are increased in people who have prediabetes. Along with regular exercise, a healthy diet:  Promotes weight loss.  Helps to control blood sugar levels.  Helps to improve the way that the body uses insulin.  What do I need to know about this eating plan?  Use the glycemic index (GI) to plan your meals. The index tells you how quickly a food will raise your blood sugar. Choose low-GI foods. These foods take a longer time to raise blood sugar.  Pay close attention to the amount  of carbohydrates in the food that you eat. Carbohydrates increase blood sugar levels.  Keep track of how many calories you take in. Eating the right amount of calories will help you to achieve a healthy weight. Losing about 7 percent of your starting weight can help to prevent type 2 diabetes.  You may want to follow a Mediterranean diet. This diet includes a lot of vegetables, lean meats or fish, whole grains, fruits, and healthy oils and fats. What foods can I eat? Grains Whole grains, such as whole-wheat or whole-grain breads, crackers, cereals, and pasta. Unsweetened oatmeal. Bulgur. Barley. Quinoa. Brown rice. Corn or whole-wheat flour tortillas or taco shells. Vegetables Lettuce. Spinach. Peas. Beets. Cauliflower. Cabbage. Broccoli. Carrots. Tomatoes. Squash. Eggplant. Herbs. Peppers. Onions. Cucumbers. Brussels sprouts. Fruits Berries. Bananas. Apples. Oranges. Grapes. Papaya. Mango. Pomegranate. Kiwi. Grapefruit. Cherries. Meats and Other Protein Sources Seafood. Lean meats, such as chicken and [place] or lean cuts of pork and beef. Tofu. Eggs. Nuts. Beans. Dairy Low-fat or fat-free dairy products, such as yogurt, cottage cheese, and cheese. Beverages Water. Tea. Coffee. Sugar-free or diet soda. Seltzer water. Milk.  Milk alternatives, such as soy or almond milk. Condiments Mustard. Relish. Low-fat, low-sugar ketchup. Low-fat, low-sugar barbecue sauce. Low-fat or fat-free mayonnaise. Sweets and Desserts Sugar-free or low-fat pudding. Sugar-free or low-fat ice cream and other frozen treats. Fats and Oils Avocado. Walnuts. Olive oil. The items listed above may not be a complete list of recommended foods or beverages. Contact your dietitian for more options. What foods are not recommended? Grains Refined white flour and flour products, such as bread, pasta, snack foods, and cereals. Beverages Sweetened drinks, such as sweet iced tea and soda. Sweets and Desserts Baked goods, such as  cake, cupcakes, pastries, cookies, and cheesecake. The items listed above may not be a complete list of foods and beverages to avoid. Contact your dietitian for more information. This information is not intended to replace advice given to you by your health care provider. Make sure you discuss any questions you have with your health care provider. Document Released: 07/26/2014 Document Revised: 08/17/2015 Document Reviewed: 04/06/2014 Elsevier Interactive Patient Education  2017 [employer].

## 2017-06-23 ENCOUNTER — Telehealth: Payer: Self-pay | Admitting: Family Medicine

## 2017-06-23 NOTE — Telephone Encounter (Signed)
Form in dr steve's folder 

## 2017-06-23 NOTE — Telephone Encounter (Signed)
Pt dropped off a form to be signed. Pt is aware Dr. Richardson Landry is out till next Monday. Form is in nurse box.

## 2017-06-26 NOTE — Telephone Encounter (Signed)
Dr. Candiss Norse will sign on Monday

## 2017-07-15 ENCOUNTER — Telehealth: Payer: Self-pay | Admitting: Family Medicine

## 2017-07-15 ENCOUNTER — Other Ambulatory Visit: Payer: Self-pay

## 2017-07-15 MED ORDER — VARENICLINE TARTRATE 1 MG PO TABS: 1.0000 mg | ORAL_TABLET | Freq: Two times a day (BID) | ORAL | 2 refills | Status: DC

## 2017-07-15 NOTE — Telephone Encounter (Signed)
Seen 3/27 for wellness. Pt states he finished starter pack and almost through with the continuing pack and he needs refills.

## 2017-07-15 NOTE — Telephone Encounter (Signed)
Ok two ref

## 2017-07-15 NOTE — Telephone Encounter (Signed)
Pt is requesting a refill on  varenicline (CHANTIX CONTINUING MONTH PAK) 1 MG tablet    [address]

## 2017-08-06 DIAGNOSIS — L820 Inflamed seborrheic keratosis: Secondary | ICD-10-CM | POA: Diagnosis not present

## 2017-08-06 DIAGNOSIS — L821 Other seborrheic keratosis: Secondary | ICD-10-CM | POA: Diagnosis not present

## 2017-08-06 DIAGNOSIS — C4442 Squamous cell carcinoma of skin of scalp and neck: Secondary | ICD-10-CM | POA: Diagnosis not present

## 2017-11-06 DIAGNOSIS — Z08 Encounter for follow-up examination after completed treatment for malignant neoplasm: Secondary | ICD-10-CM | POA: Diagnosis not present

## 2017-11-06 DIAGNOSIS — Z85828 Personal history of other malignant neoplasm of skin: Secondary | ICD-10-CM | POA: Diagnosis not present

## 2017-11-06 DIAGNOSIS — L820 Inflamed seborrheic keratosis: Secondary | ICD-10-CM | POA: Diagnosis not present

## 2018-01-06 ENCOUNTER — Encounter: Payer: Self-pay | Admitting: Family Medicine

## 2018-01-06 ENCOUNTER — Ambulatory Visit (INDEPENDENT_AMBULATORY_CARE_PROVIDER_SITE_OTHER): Payer: Commercial Managed Care - PPO | Admitting: Family Medicine

## 2018-01-06 VITALS — BP 122/82 | Temp 98.2°F | Ht 72.0 in | Wt 209.0 lb

## 2018-01-06 DIAGNOSIS — H6502 Acute serous otitis media, left ear: Secondary | ICD-10-CM

## 2018-01-06 DIAGNOSIS — M25551 Pain in right hip: Secondary | ICD-10-CM

## 2018-01-06 MED ORDER — AMOXICILLIN-POT CLAVULANATE 875-125 MG PO TABS: ORAL_TABLET | ORAL | 0 refills | Status: DC

## 2018-01-06 NOTE — Progress Notes (Signed)
   Subjective:    Patient ID: Andrew Vargas, male    DOB: February 19, 1961, 57 y.o.   MRN: [id number]  Hip Pain   The incident occurred 6 to 12 hours ago. The pain is present in the right hip. The quality of the pain is described as shooting. The pain has been fluctuating since onset. Associated symptoms include numbness and tingling. The symptoms are aggravated by movement. He has tried nothing for the symptoms.   Pt here for left ear pain. Pt states his left ear has been hurting for about 5 days. Has tried ear drops. No drainage.   Right hip pain shaprp in nature    Review of Systems  Neurological: Positive for tingling and numbness.       Objective:   Physical Exam  Alert active left TM retracted pharynx normal neck supple lungs clear.  Heart regular rate and rhythm.  Right hip some pain with lateral rotation none with medial rotation.  No point tenderness negative straight leg raise      Assessment & Plan:  Impression left otitis media with element of right hip pain extra-articular nature discussed plan antibiotics prescribed symptom care discussed anti-inflammatory medicine for hip expect slow resolution doubt significant intra-articular problem

## 2018-05-12 ENCOUNTER — Telehealth: Payer: Self-pay | Admitting: Internal Medicine

## 2018-05-12 NOTE — Telephone Encounter (Signed)
Dr.Rourk, this pt's last tcs was done with versed 6mg  and demerol 125mg . Does he need propofol?

## 2018-05-12 NOTE — Telephone Encounter (Signed)
Pt said he was past due for his colonoscopy with [facility]. He put it off due to being unemployed, but is ready to schedule now. Please advise if he needs OV or NV. [phone]

## 2018-05-18 ENCOUNTER — Telehealth: Payer: Self-pay | Admitting: Internal Medicine

## 2018-05-18 NOTE — Telephone Encounter (Signed)
Pt called again today asking if he could schedule his colonoscopy. I told him that we were waiting on a reply from [facility] if patient needed OV prior to scheduling or if we could bring him in for a NV. Please advise. Pt said we could reach him better on his house phone [phone]

## 2018-05-18 NOTE — Telephone Encounter (Signed)
We can do propofol.

## 2018-05-18 NOTE — Telephone Encounter (Signed)
Pt will need an OV- Please schedule.

## 2018-05-18 NOTE — Telephone Encounter (Signed)
Waiting to hear from [facility].

## 2018-05-19 NOTE — Telephone Encounter (Signed)
Left message with wife. OV is 3/13

## 2018-06-01 ENCOUNTER — Encounter: Payer: Self-pay | Admitting: Family Medicine

## 2018-06-01 ENCOUNTER — Ambulatory Visit (INDEPENDENT_AMBULATORY_CARE_PROVIDER_SITE_OTHER): Payer: Commercial Managed Care - PPO | Admitting: Family Medicine

## 2018-06-01 VITALS — BP 130/80 | Temp 97.4°F | Ht 72.0 in | Wt 211.0 lb

## 2018-06-01 DIAGNOSIS — L989 Disorder of the skin and subcutaneous tissue, unspecified: Secondary | ICD-10-CM | POA: Diagnosis not present

## 2018-06-01 MED ORDER — DOXYCYCLINE HYCLATE 100 MG PO TABS: 100.0000 mg | ORAL_TABLET | Freq: Two times a day (BID) | ORAL | 0 refills | Status: DC

## 2018-06-01 NOTE — Progress Notes (Signed)
   Subjective:    Patient ID: Andrew Vargas, male    DOB: 27-Jul-1960, 58 y.o.   MRN: [id number]  HPI  Patient is here today with complaints of a sore on his nose.  He noticed this two and a half weeks ago.  At first he states he thought it was acne. It was just a raised sore bump.  He says it is gradually getting larger.Still tender to touch.  Over the weekend he scratched the tip of it off. He called dermatology and they can not see him till mid April.  He has put clearasil on it in the beginning and has not put anything on it as of late.    Review of Systems  Constitutional: Negative for fever.  Skin: Negative for rash.       Objective:   Physical Exam Vitals signs and nursing note reviewed.  Constitutional:      General: He is not in acute distress.    Appearance: Normal appearance. He is not toxic-appearing.  HENT:     Head: Normocephalic and atraumatic.  Pulmonary:     Effort: No respiratory distress.  Skin:    General: Skin is warm and dry.     Comments: Raised nodular area to left tip of nose, no active bleeding or sign of infection  Neurological:     Mental Status: He is alert and oriented to person, place, and time.  Psychiatric:        Behavior: Behavior normal.           Assessment & Plan:  Lesion of skin of nose - Plan: Ambulatory referral to Dermatology  Cover for potential infection with doxy. Urgent referral to dermatology in [place] for possible basal cell carcinoma. Needs further evaluation and treatment by specialist.   Dr. Mickie Hillier was consulted on this case and is in agreement with the above treatment plan.

## 2018-06-02 NOTE — Progress Notes (Signed)
Primary Care Physician:  Mikey Kirschner, MD Primary Gastroenterologist:  Dr. Gala Romney   Chief Complaint  Patient presents with  . Colonoscopy    recall    HPI:   Andrew Vargas is a 59 y.o. male presenting today with remote history of adenomas and due for surveillance now. Last colonoscopy in 2013 with a 1 by 1.25 cm hyperplastic carpet polyp on distal side of IC valve. He will need Propofol due to increased sedation requirements at last procedure.    Has been having reflux intermittently for awhile. Currently not taking anything. Wakes him up at night. Will get up and take a Tums. No dysphagia. States mainly GERD flares when he eats the wrong thing. No rectal bleeding, changes in bowel habits, weight loss, abdomianl pain.   Has lesion on nose that PCP is concerned for malignancy. Placed on doxycycline empirically.   Past Medical History:  Diagnosis Date  . Dyspnea   . ED (erectile dysfunction)   . GERD (gastroesophageal reflux disease)   . Hyperlipidemia   . Shoulder pain   . Tobacco abuse     Past Surgical History:  Procedure Laterality Date  . COLONOSCOPY  05/02/2006   Normal rectum/Normal colon  . COLONOSCOPY  10/22/99   tubular adenoma  . COLONOSCOPY  11/21/2011   Dr. Gala Romney: 1 by 1.25 cm hyperplastic carpet polyp on distal side of IC valve  . 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 UMBILICAL HERNIA REPAIR     as a baby    Current Outpatient Medications  Medication Sig Dispense Refill  . doxycycline (VIBRA-TABS) 100 MG tablet Take 1 tablet (100 mg total) by mouth 2 (two) times daily. Take with snack and tall glass of water 20 tablet 0  . pantoprazole (PROTONIX) 40 MG tablet Take 1 tablet (40 mg total) by mouth daily. 30 minutes before breakfast 90 tablet 3   No current facility-administered medications for this visit.     Allergies as of 06/05/2018  . (No Known Allergies)    Family History  Problem Relation Age of Onset  . Colon cancer Neg Hx      Social History   Socioeconomic History  . Marital status: Married    Spouse name: Not on file  . Number of children: Not on file  . Years of education: Not on file  . Highest education level: Not on file  Occupational History  . Occupation: full time    Employer: [employer]  Social Needs  . Financial resource strain: Not on file  . Food insecurity:    Worry: Not on file    Inability: Not on file  . Transportation needs:    Medical: Not on file    Non-medical: Not on file  Tobacco Use  . Smoking status: Current Every Day Smoker    Packs/day: 1.00    Types: Cigarettes  . Smokeless tobacco: Never Used  Substance and Sexual Activity  . Alcohol use: Yes    Alcohol/week: 0.0 standard drinks    Comment: socially  . Drug use: No  . Sexual activity: Not on file  Lifestyle  . Physical activity:    Days per week: Not on file    Minutes per session: Not on file  . Stress: Not on file  Relationships  . Social connections:    Talks on phone: Not on file    Gets together: Not on file    Attends religious service: Not  on file    Active member of club or organization: Not on file    Attends meetings of clubs or organizations: Not on file    Relationship status: Not on file  . Intimate partner violence:    Fear of current or ex partner: Not on file    Emotionally abused: Not on file    Physically abused: Not on file    Forced sexual activity: Not on file  Other Topics Concern  . Not on file  Social History Narrative  . Not on file    Review of Systems: Gen: Denies any fever, chills, fatigue, weight loss, lack of appetite.  CV: Denies chest pain, heart palpitations, peripheral edema, syncope.  Resp: Denies shortness of breath at rest or with exertion. Denies wheezing or cough.  GI: see HPI. GU : Denies urinary burning, urinary frequency, urinary hesitancy MS: Denies joint pain, muscle weakness, cramps, or limitation of movement.  Derm: Denies rash, itching,  dry skin Psych: Denies depression, anxiety, memory loss, and confusion Heme: Denies bruising, bleeding, and enlarged lymph nodes.  Physical Exam: BP 127/84   Pulse 84   Temp (!) 97.5 F (36.4 C) (Oral)   Ht 6' (1.829 m)   Wt 209 lb 12.8 oz (95.2 kg)   BMI 28.45 kg/m  General:   Alert and oriented. Pleasant and cooperative. Well-nourished and well-developed.  Head:  Normocephalic and atraumatic. Eyes:  Without icterus, sclera clear and conjunctiva pink.  Ears:  Normal auditory acuity. Nose:  Lesion left-sided nare Mouth:  No deformity or lesions, oral mucosa pink.  Lungs:  Clear to auscultation bilaterally. No wheezes, rales, or rhonchi. No distress.  Heart:  S1, S2 present without murmurs appreciated.  Abdomen:  +BS, soft, non-tender and non-distended. No HSM noted. No guarding or rebound. No masses appreciated.  Rectal:  Deferred  Msk:  Symmetrical without gross deformities. Normal posture. Extremities:  Without  edema. Neurologic:  Alert and  oriented x4;  grossly normal neurologically. Psych:  Alert and cooperative. Normal mood and affect.

## 2018-06-05 ENCOUNTER — Encounter: Payer: Self-pay | Admitting: Gastroenterology

## 2018-06-05 ENCOUNTER — Telehealth: Payer: Self-pay

## 2018-06-05 ENCOUNTER — Other Ambulatory Visit: Payer: Self-pay

## 2018-06-05 ENCOUNTER — Ambulatory Visit (INDEPENDENT_AMBULATORY_CARE_PROVIDER_SITE_OTHER): Payer: Commercial Managed Care - PPO | Admitting: Gastroenterology

## 2018-06-05 VITALS — BP 127/84 | HR 84 | Temp 97.5°F | Ht 72.0 in | Wt 209.8 lb

## 2018-06-05 DIAGNOSIS — K219 Gastro-esophageal reflux disease without esophagitis: Secondary | ICD-10-CM | POA: Diagnosis not present

## 2018-06-05 DIAGNOSIS — Z86010 Personal history of colonic polyps: Secondary | ICD-10-CM

## 2018-06-05 MED ORDER — PEG 3350-KCL-NA BICARB-NACL 420 G PO SOLR: 4000.0000 mL | ORAL | 0 refills | Status: DC

## 2018-06-05 MED ORDER — PANTOPRAZOLE SODIUM 40 MG PO TBEC: 40.0000 mg | DELAYED_RELEASE_TABLET | Freq: Every day | ORAL | 3 refills | Status: DC

## 2018-06-05 NOTE — Assessment & Plan Note (Signed)
57 y.o. male presenting today with remote history of adenomas (2001) and due for surveillance now. Last colonoscopy in 2013 with a 1 by 1.25 cm hyperplastic carpet polyp on distal side of IC valve. No concerning lower GI signs/symptoms. No family history of colorectal cancer.  Proceed with TCS with Dr. Gala Romney in near future: the risks, benefits, and alternatives have been discussed with the patient in detail. The patient states understanding and desires to proceed. Propofol due to need for increased sedation historically

## 2018-06-05 NOTE — Assessment & Plan Note (Addendum)
Chronic GERD with intermittent exacerbations. Currently not on a PPI. No dysphagia or alarm symptoms. He does have several risk factors for Barrett's. I discussed EGD for screening, but he is declining this. I have sent in Protonix to the pharmacy to take daily and provided GERD diet sheet. Concerning signs/symptoms discussed and encouraged to call if changes.

## 2018-06-05 NOTE — Patient Instructions (Addendum)
We have arranged a colonoscopy with Dr. Gala Romney in the near future.  I sent in Protonix to take once each morning, 30 minutes before breakfast. I also attached a reflux handout. If you have any problems swallowing, abdominal pain, persistent, reflux, please let us know.   It was a pleasure to see you today. I strive to create trusting relationships with patients to provide genuine, compassionate, and quality care. I value your feedback. If you receive a survey regarding your visit,  I greatly appreciate you taking time to fill this out.   Annitta Needs, PhD, ANP-BC [facility]    Food Choices for Gastroesophageal Reflux Disease, Adult When you have gastroesophageal reflux disease (GERD), the foods you eat and your eating habits are very important. Choosing the right foods can help ease the discomfort of GERD. Consider working with a diet and nutrition specialist (dietitian) to help you make healthy food choices. What general guidelines should I follow?  Eating plan  Choose healthy foods low in fat, such as fruits, vegetables, whole grains, low-fat dairy products, and lean meat, fish, and poultry.  Eat frequent, small meals instead of three large meals each day. Eat your meals slowly, in a relaxed setting. Avoid bending over or lying down until 2-3 hours after eating.  Limit high-fat foods such as fatty meats or fried foods.  Limit your intake of oils, butter, and shortening to less than 8 teaspoons each day.  Avoid the following: ? Foods that cause symptoms. These may be different for different people. Keep a food diary to keep track of foods that cause symptoms. ? Alcohol. ? Drinking large amounts of liquid with meals. ? Eating meals during the 2-3 hours before bed.  Cook foods using methods other than frying. This may include baking, grilling, or broiling. Lifestyle  Maintain a healthy weight. Ask your health care provider what weight is healthy for you. If you need  to lose weight, work with your health care provider to do so safely.  Exercise for at least 30 minutes on 5 or more days each week, or as told by your health care provider.  Avoid wearing clothes that fit tightly around your waist and chest.  Do not use any products that contain nicotine or tobacco, such as cigarettes and e-cigarettes. If you need help quitting, ask your health care provider.  Sleep with the head of your bed raised. Use a wedge under the mattress or blocks under the bed frame to raise the head of the bed. What foods are not recommended? The items listed may not be a complete list. Talk with your dietitian about what dietary choices are best for you. Grains Pastries or quick breads with added fat. [place] toast. Vegetables Deep fried vegetables. [place] fries. Any vegetables prepared with added fat. Any vegetables that cause symptoms. For some people this may include tomatoes and tomato products, chili peppers, onions and garlic, and horseradish. Fruits Any fruits prepared with added fat. Any fruits that cause symptoms. For some people this may include citrus fruits, such as oranges, grapefruit, pineapple, and lemons. Meats and other protein foods High-fat meats, such as fatty beef or pork, hot dogs, ribs, ham, sausage, salami and bacon. Fried meat or protein, including fried fish and fried chicken. Nuts and nut butters. Dairy Whole milk and chocolate milk. Sour cream. Cream. Ice cream. Cream cheese. Milk shakes. Beverages Coffee and tea, with or without caffeine. Carbonated beverages. Sodas. Energy drinks. Fruit juice made with acidic fruits (such as orange  or grapefruit). Tomato juice. Alcoholic drinks. Fats and oils Butter. Margarine. Shortening. Ghee. Sweets and desserts Chocolate and cocoa. Donuts. Seasoning and other foods Pepper. Peppermint and spearmint. Any condiments, herbs, or seasonings that cause symptoms. For some people, this may include curry, hot sauce, or  vinegar-based salad dressings. Summary  When you have gastroesophageal reflux disease (GERD), food and lifestyle choices are very important to help ease the discomfort of GERD.  Eat frequent, small meals instead of three large meals each day. Eat your meals slowly, in a relaxed setting. Avoid bending over or lying down until 2-3 hours after eating.  Limit high-fat foods such as fatty meat or fried foods. This information is not intended to replace advice given to you by your health care provider. Make sure you discuss any questions you have with your health care provider. Document Released: 03/11/2005 Document Revised: 03/12/2016 Document Reviewed: 03/12/2016 Elsevier Interactive Patient Education  2019 [employer].

## 2018-06-05 NOTE — Telephone Encounter (Signed)
Called and informed pt's wife of pre-op appt 07/23/18 at 12:45pm. Letter mailed.

## 2018-06-08 NOTE — Progress Notes (Signed)
CC'D TO PCP °

## 2018-06-09 NOTE — Patient Instructions (Signed)
Called [facility], no PA needed for TCS. Ref# [id number].

## 2018-06-10 ENCOUNTER — Other Ambulatory Visit: Payer: Self-pay | Admitting: Dermatology

## 2018-06-10 DIAGNOSIS — C4492 Squamous cell carcinoma of skin, unspecified: Secondary | ICD-10-CM

## 2018-06-10 DIAGNOSIS — C44321 Squamous cell carcinoma of skin of nose: Secondary | ICD-10-CM | POA: Diagnosis not present

## 2018-06-10 HISTORY — DX: Squamous cell carcinoma of skin, unspecified: C44.92

## 2018-06-17 DIAGNOSIS — C44321 Squamous cell carcinoma of skin of nose: Secondary | ICD-10-CM | POA: Diagnosis not present

## 2018-07-16 ENCOUNTER — Telehealth: Payer: Self-pay

## 2018-07-16 NOTE — Telephone Encounter (Signed)
TCS w/Propofol w/[facility] scheduled for 07/30/18 will need to be rescheduled d/t COVID-19 restrictions. Tried to call pt, spoke to his wife. She will inform him to call office back.

## 2018-07-16 NOTE — Telephone Encounter (Signed)
Pt called office, TCS rescheduled to 09/21/18 at 10:45am. LMOVM for endo scheduler.

## 2018-07-17 NOTE — Telephone Encounter (Signed)
Pre-op appt 09/14/18 at 12:45pm. Appt letter mailed with new procedure instructions.

## 2018-07-23 ENCOUNTER — Inpatient Hospital Stay (HOSPITAL_COMMUNITY): Admission: RE | Admit: 2018-07-23 | Payer: Commercial Managed Care - PPO | Source: Ambulatory Visit

## 2018-09-09 ENCOUNTER — Ambulatory Visit (INDEPENDENT_AMBULATORY_CARE_PROVIDER_SITE_OTHER): Payer: Commercial Managed Care - PPO | Admitting: Family Medicine

## 2018-09-09 ENCOUNTER — Other Ambulatory Visit: Payer: Self-pay

## 2018-09-09 DIAGNOSIS — G44209 Tension-type headache, unspecified, not intractable: Secondary | ICD-10-CM | POA: Diagnosis not present

## 2018-09-09 NOTE — Progress Notes (Signed)
   Subjective:    Patient ID: Andrew Vargas, male    DOB: 1960/11/01, 58 y.o.   MRN: [id number] Audio plus video HPI  Patient calls with a headache for 3 days-got bad enough that he had to leave work early yesterday. Work will not let him return without an evaluation. Patient states he is also having pain in his middle back on his right side.  Virtual Visit via Video Note  I connected with Tyleek T Nesser on 09/09/18 at  4:10 PM EDT by a video enabled telemedicine application and verified that I am speaking with the correct person using two identifiers.  Location: Patient: home Provider: office   I discussed the limitations of evaluation and management by telemedicine and the availability of in person appointments. The patient expressed understanding and agreed to proceed.  History of Present Illness:    Observations/Objective:   Assessment and Plan:   Follow Up Instructions:    I discussed the assessment and treatment plan with the patient. The patient was provided an opportunity to ask questions and all were answered. The patient agreed with the plan and demonstrated an understanding of the instructions.   The patient was advised to call back or seek an in-person evaluation if the symptoms worsen or if the condition fails to improve as anticipated.  I provided 18 minutes of non-face-to-face time during this encounter.   Headache is bitemporal.  Sharp at times achy at times radiating posteriorly to the nuchal region.  Chest pain is sharp.  Focal.  Worse with his usual chronic smoker's cough.  No new cough no sore throat no noise no fever no chills.  The workplace was concerned about potential exposure    Review of Systems No headache, no major weight loss or weight gain, no chest pain no back pain abdominal pain no change in bowel habits complete ROS otherwise negative     Objective:   Physical Exam  Virtual      Assessment & Plan:  Impression probable tension headache  and and focal chest wall pain.  Highly unlikely to be related to COVID-19.  Discussed.  Do not recommend testing.  Recommend simply symptomatic care with expectation of gradual resolution

## 2018-09-11 ENCOUNTER — Encounter: Payer: Self-pay | Admitting: Family Medicine

## 2018-09-14 ENCOUNTER — Inpatient Hospital Stay (HOSPITAL_COMMUNITY): Admission: RE | Admit: 2018-09-14 | Payer: Commercial Managed Care - PPO | Source: Ambulatory Visit

## 2018-09-17 ENCOUNTER — Other Ambulatory Visit: Payer: Self-pay

## 2018-09-17 ENCOUNTER — Encounter (HOSPITAL_COMMUNITY)
Admission: RE | Admit: 2018-09-17 | Discharge: 2018-09-17 | Disposition: A | Discharge status: Home / Self Care | Payer: Commercial Managed Care - PPO | Source: Ambulatory Visit | Attending: Internal Medicine | Admitting: Internal Medicine

## 2018-09-17 ENCOUNTER — Telehealth: Payer: Self-pay | Admitting: Internal Medicine

## 2018-09-17 ENCOUNTER — Other Ambulatory Visit (HOSPITAL_COMMUNITY)
Admission: RE | Admit: 2018-09-17 | Discharge: 2018-09-17 | Disposition: A | Discharge status: Home / Self Care | Payer: Commercial Managed Care - PPO | Source: Ambulatory Visit | Attending: Internal Medicine | Admitting: Internal Medicine

## 2018-09-17 DIAGNOSIS — Z1159 Encounter for screening for other viral diseases: Secondary | ICD-10-CM | POA: Diagnosis not present

## 2018-09-17 MED ORDER — PEG 3350-KCL-NA BICARB-NACL 420 G PO SOLR: 4000.0000 mL | Freq: Once | ORAL | 0 refills | Status: AC

## 2018-09-17 NOTE — Telephone Encounter (Signed)
Pt said he is scheduled colonoscopy with RMR on 6/29 and [facility] on [address] doesn't have the Rx for the prep.

## 2018-09-17 NOTE — Telephone Encounter (Signed)
Rx resent in 

## 2018-09-19 LAB — NOVEL CORONAVIRUS, NAA (HOSP ORDER, SEND-OUT TO REF LAB; TAT 18-24 HRS): SARS-CoV-2, NAA: NOT DETECTED

## 2018-09-21 ENCOUNTER — Encounter (HOSPITAL_COMMUNITY): Payer: Self-pay | Admitting: *Deleted

## 2018-09-21 ENCOUNTER — Encounter (HOSPITAL_COMMUNITY)
Admission: RE | Disposition: A | Discharge status: Home / Self Care | Payer: Self-pay | Source: Home / Self Care | Attending: Internal Medicine

## 2018-09-21 ENCOUNTER — Ambulatory Visit (HOSPITAL_COMMUNITY)
Admission: RE | Admit: 2018-09-21 | Discharge: 2018-09-21 | Disposition: A | Discharge status: Home / Self Care | Payer: Commercial Managed Care - PPO | Attending: Internal Medicine | Admitting: Internal Medicine

## 2018-09-21 ENCOUNTER — Ambulatory Visit (HOSPITAL_COMMUNITY): Payer: Commercial Managed Care - PPO | Admitting: Anesthesiology

## 2018-09-21 DIAGNOSIS — R0600 Dyspnea, unspecified: Secondary | ICD-10-CM | POA: Diagnosis not present

## 2018-09-21 DIAGNOSIS — Z1211 Encounter for screening for malignant neoplasm of colon: Secondary | ICD-10-CM | POA: Diagnosis present

## 2018-09-21 DIAGNOSIS — D120 Benign neoplasm of cecum: Secondary | ICD-10-CM | POA: Insufficient documentation

## 2018-09-21 DIAGNOSIS — K5730 Diverticulosis of large intestine without perforation or abscess without bleeding: Secondary | ICD-10-CM | POA: Diagnosis not present

## 2018-09-21 DIAGNOSIS — F17210 Nicotine dependence, cigarettes, uncomplicated: Secondary | ICD-10-CM | POA: Insufficient documentation

## 2018-09-21 DIAGNOSIS — Z860100 Personal history of colon polyps, unspecified: Secondary | ICD-10-CM

## 2018-09-21 DIAGNOSIS — K219 Gastro-esophageal reflux disease without esophagitis: Secondary | ICD-10-CM | POA: Diagnosis not present

## 2018-09-21 DIAGNOSIS — E785 Hyperlipidemia, unspecified: Secondary | ICD-10-CM | POA: Diagnosis not present

## 2018-09-21 DIAGNOSIS — K635 Polyp of colon: Secondary | ICD-10-CM | POA: Diagnosis not present

## 2018-09-21 DIAGNOSIS — D123 Benign neoplasm of transverse colon: Secondary | ICD-10-CM | POA: Insufficient documentation

## 2018-09-21 HISTORY — PX: COLONOSCOPY WITH PROPOFOL: SHX5780

## 2018-09-21 HISTORY — PX: POLYPECTOMY: SHX5525

## 2018-09-21 SURGERY — COLONOSCOPY WITH PROPOFOL
Anesthesia: General

## 2018-09-21 MED ORDER — HYDROCODONE-ACETAMINOPHEN 7.5-325 MG PO TABS: 1.0000 | ORAL_TABLET | Freq: Once | ORAL | Status: DC | PRN

## 2018-09-21 MED ORDER — LACTATED RINGERS IV SOLN
INTRAVENOUS | Status: DC
  Administered 2018-09-21: 10:00:00 via INTRAVENOUS

## 2018-09-21 MED ORDER — KETAMINE HCL 10 MG/ML IJ SOLN
INTRAMUSCULAR | Status: DC | PRN
  Administered 2018-09-21: 20 mg via INTRAVENOUS

## 2018-09-21 MED ORDER — PROPOFOL 10 MG/ML IV BOLUS
INTRAVENOUS | Status: DC | PRN
  Administered 2018-09-21: 50 mg via INTRAVENOUS

## 2018-09-21 MED ORDER — PROPOFOL 500 MG/50ML IV EMUL
INTRAVENOUS | Status: DC | PRN
  Administered 2018-09-21: 100 ug/kg/min via INTRAVENOUS

## 2018-09-21 MED ORDER — KETAMINE HCL 50 MG/5ML IJ SOSY
PREFILLED_SYRINGE | INTRAMUSCULAR | Status: AC
  Filled 2018-09-21: qty 5

## 2018-09-21 MED ORDER — CHLORHEXIDINE GLUCONATE CLOTH 2 % EX PADS: 6.0000 | MEDICATED_PAD | Freq: Once | CUTANEOUS | Status: DC

## 2018-09-21 MED ORDER — PROMETHAZINE HCL 25 MG/ML IJ SOLN: 6.2500 mg | INTRAMUSCULAR | Status: DC | PRN

## 2018-09-21 MED ORDER — MIDAZOLAM HCL 2 MG/2ML IJ SOLN: 0.5000 mg | Freq: Once | INTRAMUSCULAR | Status: DC | PRN

## 2018-09-21 MED ORDER — HYDROMORPHONE HCL 1 MG/ML IJ SOLN: 0.2500 mg | INTRAMUSCULAR | Status: DC | PRN

## 2018-09-21 MED ORDER — PROPOFOL 10 MG/ML IV BOLUS
INTRAVENOUS | Status: AC
  Filled 2018-09-21: qty 40

## 2018-09-21 NOTE — H&P (Signed)
@LOGO @   Primary Care Physician:  Mikey Kirschner, MD Primary Gastroenterologist:  Dr. Gala Romney Pre-Procedure History & Physical: HPI:  Andrew Vargas is a 58 y.o. male here for for surveillance colonoscopy.  History of large hyperplastic polyps as well as adenomas previously.  Past Medical History:  Diagnosis Date  . Dyspnea   . ED (erectile dysfunction)   . GERD (gastroesophageal reflux disease)   . Hyperlipidemia   . Shoulder pain   . Tobacco abuse     Past Surgical History:  Procedure Laterality Date  . COLONOSCOPY  05/02/2006   Normal rectum/Normal colon  . COLONOSCOPY  10/22/99   tubular adenoma  . COLONOSCOPY  11/21/2011   Dr. Gala Romney: 1 by 1.25 cm hyperplastic carpet polyp on distal side of IC valve  . 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 UMBILICAL HERNIA REPAIR     as a baby    Prior to Admission medications   Not on File    Allergies as of 06/05/2018  . (No Known Allergies)    Family History  Problem Relation Age of Onset  . Colon cancer Neg Hx     Social History   Socioeconomic History  . Marital status: Married    Spouse name: Not on file  . Number of children: Not on file  . Years of education: Not on file  . Highest education level: Not on file  Occupational History  . Occupation: full time    Employer: [employer]  Social Needs  . Financial resource strain: Not on file  . Food insecurity    Worry: Not on file    Inability: Not on file  . Transportation needs    Medical: Not on file    Non-medical: Not on file  Tobacco Use  . Smoking status: Current Every Day Smoker    Packs/day: 1.00    Types: Cigarettes  . Smokeless tobacco: Never Used  Substance and Sexual Activity  . Alcohol use: Yes    Alcohol/week: 0.0 standard drinks    Comment: socially  . Drug use: No  . Sexual activity: Not on file  Lifestyle  . Physical activity    Days per week: Not on file    Minutes per session: Not on file  . Stress: Not on file   Relationships  . Social Herbalist on phone: Not on file    Gets together: Not on file    Attends religious service: Not on file    Active member of club or organization: Not on file    Attends meetings of clubs or organizations: Not on file    Relationship status: Not on file  . Intimate partner violence    Fear of current or ex partner: Not on file    Emotionally abused: Not on file    Physically abused: Not on file    Forced sexual activity: Not on file  Other Topics Concern  . Not on file  Social History Narrative  . Not on file    Review of Systems: See HPI, otherwise negative ROS  Physical Exam: BP 115/80   Pulse 85   Temp 97.7 F (36.5 C) (Oral)   Resp 18   Ht 6' (1.829 m)   Wt 94.3 kg   SpO2 96%   BMI 28.21 kg/m  General:   Alert,  Well-developed, well-nourished, pleasant and cooperative in NAD Neck:  Supple; no masses or thyromegaly. No significant cervical adenopathy. Lungs:  Clear throughout to auscultation.   No wheezes, crackles, or rhonchi. No acute distress. Heart:  Regular rate and rhythm; no murmurs, clicks, rubs,  or gallops. Abdomen: Non-distended, normal bowel sounds.  Soft and nontender without appreciable mass or hepatosplenomegaly.  Pulses:  Normal pulses noted. Extremities:  Without clubbing or edema.  Impression/Plan: Pleasant 58 year old gentleman history of colonic polyp.  Here for surveillance examination.  The risks, benefits, limitations, alternatives and imponderables have been reviewed with the patient. Potential for esophageal dilation, biopsy, etc. have also been reviewed.  Questions have been answered. All parties agreeable.     Notice: This dictation was prepared with Dragon dictation along with smaller phrase technology. Any transcriptional errors that result from this process are unintentional and may not be corrected upon review.

## 2018-09-21 NOTE — Transfer of Care (Signed)
Immediate Anesthesia Transfer of Care Note  Patient: Buddy T Radziewicz  Procedure(s) Performed: COLONOSCOPY WITH PROPOFOL (N/A ) POLYPECTOMY  Patient Location: PACU  Anesthesia Type:MAC  Level of Consciousness: awake, alert  and oriented  Airway & Oxygen Therapy: Patient Spontanous Breathing  Post-op Assessment: Report given to RN and Post -op Vital signs reviewed and stable  Post vital signs: Reviewed and stable  Last Vitals:  Vitals Value Taken Time  BP    Temp    Pulse 92 09/21/18 1202  Resp 23 09/21/18 1202  SpO2 92 % 09/21/18 1202  Vitals shown include unvalidated device data.  Last Pain:  Vitals:   09/21/18 1135  TempSrc:   PainSc: 0-No pain      Patients Stated Pain Goal: 5 (66/29/47 6546)  Complications: No apparent anesthesia complications

## 2018-09-21 NOTE — Anesthesia Postprocedure Evaluation (Signed)
Anesthesia Post Note  Patient: Andrew Vargas  Procedure(s) Performed: COLONOSCOPY WITH PROPOFOL (N/A ) POLYPECTOMY  Patient location during evaluation: PACU Anesthesia Type: MAC Level of consciousness: awake and alert and oriented Pain management: pain level controlled Vital Signs Assessment: post-procedure vital signs reviewed and stable Respiratory status: spontaneous breathing, respiratory function stable and nonlabored ventilation Cardiovascular status: stable Postop Assessment: no apparent nausea or vomiting Anesthetic complications: no     Last Vitals:  Vitals:   09/21/18 0938  BP: 115/80  Pulse: 85  Resp: 18  Temp: 36.5 C  SpO2: 96%    Last Pain:  Vitals:   09/21/18 1135  TempSrc:   PainSc: 0-No pain                 Mischelle Reeg

## 2018-09-21 NOTE — Discharge Instructions (Signed)
Colonoscopy Discharge Instructions  Read the instructions outlined below and refer to this sheet in the next few weeks. These discharge instructions provide you with general information on caring for yourself after you leave the hospital. Your doctor may also give you specific instructions. While your treatment has been planned according to the most current medical practices available, unavoidable complications occasionally occur. If you have any problems or questions after discharge, call Dr. Gala Romney at [phone]. ACTIVITY  You may resume your regular activity, but move at a slower pace for the next 24 hours.   Take frequent rest periods for the next 24 hours.   Walking will help get rid of the air and reduce the bloated feeling in your belly (abdomen).   No driving for 24 hours (because of the medicine (anesthesia) used during the test).    Do not sign any important legal documents or operate any machinery for 24 hours (because of the anesthesia used during the test).      Monitored Anesthesia Care, Care After These instructions provide you with information about caring for yourself after your procedure. Your health care provider may also give you more specific instructions. Your treatment has been planned according to current medical practices, but problems sometimes occur. Call your health care provider if you have any problems or questions after your procedure. What can I expect after the procedure? After your procedure, you may:  Feel sleepy for several hours.  Feel clumsy and have poor balance for several hours.  Feel forgetful about what happened after the procedure.  Have poor judgment for several hours.  Feel nauseous or vomit.  Have a sore throat if you had a breathing tube during the procedure. Follow these instructions at home: For at least 24 hours after the procedure:      Have a responsible adult stay with you. It is important to have someone help care for you  until you are awake and alert.  Rest as needed.  Do not: ? Participate in activities in which you could fall or become injured. ? Drive. ? Use heavy machinery. ? Drink alcohol. ? Take sleeping pills or medicines that cause drowsiness. ? Make important decisions or sign legal documents. ? Take care of children on your own. Eating and drinking  Follow the diet that is recommended by your health care provider.  If you vomit, drink water, juice, or soup when you can drink without vomiting.  Make sure you have little or no nausea before eating solid foods. General instructions  Take over-the-counter and prescription medicines only as told by your health care provider.  If you have sleep apnea, surgery and certain medicines can increase your risk for breathing problems. Follow instructions from your health care provider about wearing your sleep device: ? Anytime you are sleeping, including during daytime naps. ? While taking prescription pain medicines, sleeping medicines, or medicines that make you drowsy.  If you smoke, do not smoke without supervision.  Keep all follow-up visits as told by your health care provider. This is important. Contact a health care provider if:  You keep feeling nauseous or you keep vomiting.  You feel light-headed.  You develop a rash.  You have a fever. Get help right away if:  You have trouble breathing. Summary  For several hours after your procedure, you may feel sleepy and have poor judgment.  Have a responsible adult stay with you for at least 24 hours or until you are awake and alert. This information is  not intended to replace advice given to you by your health care provider. Make sure you discuss any questions you have with your health care provider. Document Released: 07/02/2015 Document Revised: 06/09/2017 Document Reviewed: 07/02/2015 Elsevier Patient Education  2020 [place]  Drink plenty of fluids.   You may  resume your normal diet as instructed by your doctor.   Begin with a light meal and progress to your normal diet. Heavy or fried foods are harder to digest and may make you feel sick to your stomach (nauseated).   Avoid alcoholic beverages for 24 hours or as instructed.  MEDICATIONS  You may resume your normal medications unless your doctor tells you otherwise.  WHAT YOU CAN EXPECT TODAY  Some feelings of bloating in the abdomen.   Passage of more gas than usual.   Spotting of blood in your stool or on the toilet paper.  IF YOU HAD POLYPS REMOVED DURING THE COLONOSCOPY:  No aspirin products for 7 days or as instructed.   No alcohol for 7 days or as instructed.   Eat a soft diet for the next 24 hours.  FINDING OUT THE RESULTS OF YOUR TEST Not all test results are available during your visit. If your test results are not back during the visit, make an appointment with your caregiver to find out the results. Do not assume everything is normal if you have not heard from your caregiver or the medical facility. It is important for you to follow up on all of your test results.  SEEK IMMEDIATE MEDICAL ATTENTION IF:  You have more than a spotting of blood in your stool.   Your belly is swollen (abdominal distention).   You are nauseated or vomiting.   You have a temperature over 101.   You have abdominal pain or discomfort that is severe or gets worse throughout the day.    Diverticulosis and colon polyp information provided  Further recommendations to follow pending review of pathology report  At patient's request, I called [facility] [place] at [phone] and discussed my impression and recommendations    Diverticulosis  Diverticulosis is a condition that develops when small pouches (diverticula) form in the wall of the large intestine (colon). The colon is where water is absorbed and stool is formed. The pouches form when the inside layer of the colon pushes through weak spots in  the outer layers of the colon. You may have a few pouches or many of them. What are the causes? The cause of this condition is not known. What increases the risk? The following factors may make you more likely to develop this condition:  Being older than age 32. Your risk for this condition increases with age. Diverticulosis is rare among people younger than age 34. By age 49, many people have it.  Eating a low-fiber diet.  Having frequent constipation.  Being overweight.  Not getting enough exercise.  Smoking.  Taking over-the-counter pain medicines, like aspirin and ibuprofen.  Having a family history of diverticulosis. What are the signs or symptoms? In most people, there are no symptoms of this condition. If you do have symptoms, they may include:  Bloating.  Cramps in the abdomen.  Constipation or diarrhea.  Pain in the lower left side of the abdomen. How is this diagnosed? This condition is most often diagnosed during an exam for other colon problems. Because diverticulosis usually has no symptoms, it often cannot be diagnosed independently. This condition may be diagnosed by:  Using  a flexible scope to examine the colon (colonoscopy).  Taking an X-ray of the colon after dye has been put into the colon (barium enema).  Doing a CT scan. How is this treated? You may not need treatment for this condition if you have never developed an infection related to diverticulosis. If you have had an infection before, treatment may include:  Eating a high-fiber diet. This may include eating more fruits, vegetables, and grains.  Taking a fiber supplement.  Taking a live bacteria supplement (probiotic).  Taking medicine to relax your colon.  Taking antibiotic medicines. Follow these instructions at home:  Drink 6-8 glasses of water or more each day to prevent constipation.  Try not to strain when you have a bowel movement.  If you have had an infection before: ? Eat  more fiber as directed by your health care provider or your diet and nutrition specialist (dietitian). ? Take a fiber supplement or probiotic, if your health care provider approves.  Take over-the-counter and prescription medicines only as told by your health care provider.  If you were prescribed an antibiotic, take it as told by your health care provider. Do not stop taking the antibiotic even if you start to feel better.  Keep all follow-up visits as told by your health care provider. This is important. Contact a health care provider if:  You have pain in your abdomen.  You have bloating.  You have cramps.  You have not had a bowel movement in 3 days. Get help right away if:  Your pain gets worse.  Your bloating becomes very bad.  You have a fever or chills, and your symptoms suddenly get worse.  You vomit.  You have bowel movements that are bloody or black.  You have bleeding from your rectum. Summary  Diverticulosis is a condition that develops when small pouches (diverticula) form in the wall of the large intestine (colon).  You may have a few pouches or many of them.  This condition is most often diagnosed during an exam for other colon problems.  If you have had an infection related to diverticulosis, treatment may include increasing the fiber in your diet, taking supplements, or taking medicines. This information is not intended to replace advice given to you by your health care provider. Make sure you discuss any questions you have with your health care provider. Document Released: 12/07/2003 Document Revised: 02/21/2017 Document Reviewed: 01/29/2016 Elsevier Patient Education  [place].    Colon Polyps  Polyps are tissue growths inside the body. Polyps can grow in many places, including the large intestine (colon). A polyp may be a round bump or a mushroom-shaped growth. You could have one polyp or several. Most colon polyps are noncancerous  (benign). However, some colon polyps can become cancerous over time. Finding and removing the polyps early can help prevent this. What are the causes? The exact cause of colon polyps is not known. What increases the risk? You are more likely to develop this condition if you:  Have a family history of colon cancer or colon polyps.  Are older than 66 or older than 45 if you are African American.  Have inflammatory bowel disease, such as ulcerative colitis or Crohn's disease.  Have certain hereditary conditions, such as: ? Familial adenomatous polyposis. ? Lynch syndrome. ? Turcot syndrome. ? Peutz-Jeghers syndrome.  Are overweight.  Smoke cigarettes.  Do not get enough exercise.  Drink too much alcohol.  Eat a diet that is high in fat  and red meat and low in fiber.  Had childhood cancer that was treated with abdominal radiation. What are the signs or symptoms? Most polyps do not cause symptoms. If you have symptoms, they may include:  Blood coming from your rectum when having a bowel movement.  Blood in your stool. The stool may look dark red or black.  Abdominal pain.  A change in bowel habits, such as constipation or diarrhea. How is this diagnosed? This condition is diagnosed with a colonoscopy. This is a procedure in which a lighted, flexible scope is inserted into the anus and then passed into the colon to examine the area. Polyps are sometimes found when a colonoscopy is done as part of routine cancer screening tests. How is this treated? Treatment for this condition involves removing any polyps that are found. Most polyps can be removed during a colonoscopy. Those polyps will then be tested for cancer. Additional treatment may be needed depending on the results of testing. Follow these instructions at home: Lifestyle  Maintain a healthy weight, or lose weight if recommended by your health care provider.  Exercise every day or as told by your health care  provider.  Do not use any products that contain nicotine or tobacco, such as cigarettes and e-cigarettes. If you need help quitting, ask your health care provider.  If you drink alcohol, limit how much you have: ? 0-1 drink a day for women. ? 0-2 drinks a day for men.  Be aware of how much alcohol is in your drink. In the [place] one drink equals one 12 oz bottle of beer (355 mL), one 5 oz glass of wine (148 mL), or one 1 oz shot of hard liquor (44 mL). Eating and drinking   Eat foods that are high in fiber, such as fruits, vegetables, and whole grains.  Eat foods that are high in calcium and vitamin D, such as milk, cheese, yogurt, eggs, liver, fish, and broccoli.  Limit foods that are high in fat, such as fried foods and desserts.  Limit the amount of red meat and processed meat you eat, such as hot dogs, sausage, bacon, and lunch meats. General instructions  Keep all follow-up visits as told by your health care provider. This is important. ? This includes having regularly scheduled colonoscopies. ? Talk to your health care provider about when you need a colonoscopy. Contact a health care provider if:  You have new or worsening bleeding during a bowel movement.  You have new or increased blood in your stool.  You have a change in bowel habits.  You lose weight for no known reason. Summary  Polyps are tissue growths inside the body. Polyps can grow in many places, including the colon.  Most colon polyps are noncancerous (benign), but some can become cancerous over time.  This condition is diagnosed with a colonoscopy.  Treatment for this condition involves removing any polyps that are found. Most polyps can be removed during a colonoscopy. This information is not intended to replace advice given to you by your health care provider. Make sure you discuss any questions you have with your health care provider. Document Released: 12/06/2003 Document Revised: 06/26/2017 Document  Reviewed: 06/26/2017 Elsevier Patient Education  2020 [employer].

## 2018-09-21 NOTE — Anesthesia Preprocedure Evaluation (Addendum)
Anesthesia Evaluation  Patient identified by MRN, date of birth, ID band Patient awake    Reviewed: Allergy & Precautions, NPO status , Patient's Chart, lab work & pertinent test results  Airway Mallampati: II  TM Distance: >3 FB Neck ROM: Full    Dental no notable dental hx. (+) Teeth Intact   Pulmonary shortness of breath and with exertion, Current Smoker,  Reports 1 PPD smoker  Denies COPd or inhaler use of any kind   Pulmonary exam normal breath sounds clear to auscultation       Cardiovascular Exercise Tolerance: Good negative cardio ROS Normal cardiovascular examI Rhythm:Regular Rate:Normal     Neuro/Psych negative neurological ROS  negative psych ROS   GI/Hepatic Neg liver ROS, GERD  Controlled,Only PRN OTC meds  Denies GERD SX today    Endo/Other  negative endocrine ROS  Renal/GU negative Renal ROS  negative genitourinary   Musculoskeletal  (+) Arthritis , Osteoarthritis,    Abdominal   Peds negative pediatric ROS (+)  Hematology negative hematology ROS (+)   Anesthesia Other Findings   Reproductive/Obstetrics negative OB ROS                             Anesthesia Physical Anesthesia Plan  ASA: II  Anesthesia Plan: General   Post-op Pain Management:    Induction: Intravenous  PONV Risk Score and Plan: 1 and Propofol infusion and Treatment may vary due to age or medical condition  Airway Management Planned: Nasal Cannula and Simple Face Mask  Additional Equipment:   Intra-op Plan:   Post-operative Plan:   Informed Consent: I have reviewed the patients History and Physical, chart, labs and discussed the procedure including the risks, benefits and alternatives for the proposed anesthesia with the patient or authorized representative who has indicated his/her understanding and acceptance.     Dental advisory given  Plan Discussed with: CRNA  Anesthesia Plan  Comments: (Plan Full PPE use  Plan GA with GETA PRN-d/w pt -WTP with same after Q&A)        Anesthesia Quick Evaluation

## 2018-09-21 NOTE — Op Note (Signed)
[facility] Patient Name: Andrew Vargas Procedure Date: 09/21/2018 11:27 AM MRN: [id number] Date of Birth: 1961-03-04 Attending MD: Norvel Richards , MD CSN: [id number] Age: 58 Admit Type: Outpatient Procedure:                Colonoscopy Indications:              High risk colon cancer surveillance: Personal                            history of colonic polyps Providers:                Norvel Richards, MD, Otis Peak B. Sharon Seller, RN,                            Randa Spike, Technician Referring MD:              Medicines:                Propofol per Anesthesia Complications:            No immediate complications. Estimated Blood Loss:     Estimated blood loss was minimal. Procedure:                Pre-Anesthesia Assessment:                           - Prior to the procedure, a History and Physical                            was performed, and patient medications and                            allergies were reviewed. The patient's tolerance of                            previous anesthesia was also reviewed. The risks                            and benefits of the procedure and the sedation                            options and risks were discussed with the patient.                            All questions were answered, and informed consent                            was obtained. Prior Anticoagulants: The patient has                            taken no previous anticoagulant or antiplatelet                            agents. ASA Grade Assessment: II - A patient with  mild systemic disease. After reviewing the risks                            and benefits, the patient was deemed in                            satisfactory condition to undergo the procedure.                           After obtaining informed consent, the colonoscope                            was passed under direct vision. Throughout the                            procedure, the  patient's blood pressure, pulse, and                            oxygen saturations were monitored continuously. The                            CF-HQ190L ([id number]) scope was introduced through                            the anus and advanced to the the cecum, identified                            by appendiceal orifice and ileocecal valve. The                            colonoscopy was performed without difficulty. The                            patient tolerated the procedure well. The quality                            of the bowel preparation was adequate. The                            colonoscopy was performed without difficulty. The                            patient tolerated the procedure well. The ileocecal                            valve, appendiceal orifice, and rectum were                            photographed. Anatomical landmarks were                            photographed. The entire colon was examined. The  quality of the bowel preparation was adequate. Scope In: 11:41:50 AM Scope Out: 11:56:23 AM Scope Withdrawal Time: 0 hours 9 minutes 35 seconds  Total Procedure Duration: 0 hours 14 minutes 33 seconds  Findings:      The perianal and digital rectal examinations were normal.      Diverticula were found in the sigmoid colon and descending colon.      Three semi-pedunculated polyps were found in the transverse colon. The       polyps were 4 to 6 mm in size. These polyps were removed with a cold       snare. Resection and retrieval were complete. Estimated blood loss was       minimal.      A 4 mm polyp was found in the cecum. The polyp was sessile. The polyp       was removed with a cold snare. Resection and retrieval were complete.       Estimated blood loss was minimal. Estimated blood loss was minimal.      The exam was otherwise without abnormality on direct and retroflexion       views. Impression:               - Diverticulosis in the  sigmoid colon and in the                            descending colon.                           - Three 4 to 6 mm polyps in the transverse colon,                            removed with a cold snare. Resected and retrieved.                           - One 4 mm polyp in the cecum, removed with a cold                            snare. Resected and retrieved.                           - The examination was otherwise normal on direct                            and retroflexion views. Moderate Sedation:      Moderate (conscious) sedation was personally administered by an       anesthesia professional. The following parameters were monitored: oxygen       saturation, heart rate, blood pressure, respiratory rate, EKG, adequacy       of pulmonary ventilation, and response to care. Recommendation:           - Patient has a contact number available for                            emergencies. The signs and symptoms of potential                            delayed complications were discussed with the  patient. Return to normal activities tomorrow.                            Written discharge instructions were provided to the                            patient.                           - Resume previous diet.                           - Continue present medications.                           - Repeat colonoscopy date to be determined after                            pending pathology results are reviewed for                            surveillance based on pathology results.                           - Return to GI office (date not yet determined). Procedure Code(s):        --- Professional ---                           [phone], Colonoscopy, flexible; with removal of                            tumor(s), polyp(s), or other lesion(s) by snare                            technique Diagnosis Code(s):        --- Professional ---                           K63.5, Polyp of colon                            Z86.010, Personal history of colonic polyps                           K57.30, Diverticulosis of large intestine without                            perforation or abscess without bleeding CPT copyright 2019 American Medical Association. All rights reserved. The codes documented in this report are preliminary and upon coder review may  be revised to meet current compliance requirements. [name]. Kwesi Sangha, MD Norvel Richards, MD 09/21/2018 12:07:36 PM This report has been signed electronically. Number of Addenda: 0

## 2018-09-23 ENCOUNTER — Encounter: Payer: Self-pay | Admitting: Internal Medicine

## 2018-09-24 ENCOUNTER — Encounter (HOSPITAL_COMMUNITY): Payer: Self-pay | Admitting: Internal Medicine

## 2018-12-21 ENCOUNTER — Ambulatory Visit (INDEPENDENT_AMBULATORY_CARE_PROVIDER_SITE_OTHER): Payer: Commercial Managed Care - PPO | Admitting: Family Medicine

## 2018-12-21 ENCOUNTER — Encounter: Payer: Self-pay | Admitting: Family Medicine

## 2018-12-21 ENCOUNTER — Other Ambulatory Visit: Payer: Self-pay

## 2018-12-21 DIAGNOSIS — R6889 Other general symptoms and signs: Secondary | ICD-10-CM

## 2018-12-21 DIAGNOSIS — J329 Chronic sinusitis, unspecified: Secondary | ICD-10-CM

## 2018-12-21 DIAGNOSIS — Z20822 Contact with and (suspected) exposure to covid-19: Secondary | ICD-10-CM

## 2018-12-21 MED ORDER — AMOXICILLIN-POT CLAVULANATE 875-125 MG PO TABS: 1.0000 | ORAL_TABLET | Freq: Two times a day (BID) | ORAL | 0 refills | Status: AC

## 2018-12-21 NOTE — Progress Notes (Signed)
   Subjective:  Audio only  Patient ID: Andrew Vargas, male    DOB: [birth year], 57 y.o.   MRN: [id number]  Cough This is a new problem. The current episode started in the past 7 days. Associated symptoms include nasal congestion and a sore throat. He has tried OTC cough suppressant for the symptoms.      Review of Systems  HENT: Positive for sore throat.   Respiratory: Positive for cough.    Virtual Visit via Video Note  I connected with Andrew Vargas on 12/21/18 at 10:00 AM EDT by a video enabled telemedicine application and verified that I am speaking with the correct person using two identifiers.  Location: Patient: home Provider: office   I discussed the limitations of evaluation and management by telemedicine and the availability of in person appointments. The patient expressed understanding and agreed to proceed.  History of Present Illness:    Observations/Objective:   Assessment and Plan:   Follow Up Instructions:    I discussed the assessment and treatment plan with the patient. The patient was provided an opportunity to ask questions and all were answered. The patient agreed with the plan and demonstrated an understanding of the instructions.   The patient was advised to call back or seek an in-person evaluation if the symptoms worsen or if the condition fails to improve as anticipated.  I provided 20 minutes of non-face-to-face time during this encounter.   Sore throat mon n tue  Took sinus meds  Has also had h a  Woke up with bad cough  occas prod   No fever  Energy not the best   Decent appetite  Did not work thids weekend     Objective:   Physical Exam  Virtual      Assessment & Plan:  Impression probable rhinosinusitis.  Cannot rule out COVID-19.  Discussed.  Will cover with antibiotics.  Symptom care discussed warning signs discussed appropriate testing

## 2018-12-22 ENCOUNTER — Other Ambulatory Visit: Payer: Self-pay

## 2018-12-22 DIAGNOSIS — Z20822 Contact with and (suspected) exposure to covid-19: Secondary | ICD-10-CM

## 2018-12-24 LAB — NOVEL CORONAVIRUS, NAA: SARS-CoV-2, NAA: NOT DETECTED

## 2019-01-13 ENCOUNTER — Other Ambulatory Visit: Payer: Self-pay

## 2019-01-13 ENCOUNTER — Ambulatory Visit (INDEPENDENT_AMBULATORY_CARE_PROVIDER_SITE_OTHER): Payer: Commercial Managed Care - PPO | Admitting: Family Medicine

## 2019-01-13 ENCOUNTER — Encounter: Payer: Self-pay | Admitting: Family Medicine

## 2019-01-13 VITALS — BP 112/82 | Temp 98.4°F | Wt 202.8 lb

## 2019-01-13 DIAGNOSIS — Z23 Encounter for immunization: Secondary | ICD-10-CM | POA: Diagnosis not present

## 2019-01-13 DIAGNOSIS — M79642 Pain in left hand: Secondary | ICD-10-CM

## 2019-01-13 DIAGNOSIS — S61032A Puncture wound without foreign body of left thumb without damage to nail, initial encounter: Secondary | ICD-10-CM

## 2019-01-13 MED ORDER — CEPHALEXIN 500 MG PO CAPS: 500.0000 mg | ORAL_CAPSULE | Freq: Three times a day (TID) | ORAL | 0 refills | Status: DC

## 2019-01-13 NOTE — Progress Notes (Signed)
   Subjective:    Patient ID: Andrew Vargas, male    DOB: 1960-10-16, 58 y.o.   MRN: [id number]  HPI  Patient arrives with an injury to left thumb. Patient states he suck a wire in it yesterday   Puncture wound with the wiring thumb.  Ha  Notes obvious pain and tenderness  Is not had a tetanus shot for some time  No fever chills  Notes some swelling and throbbing discomfort.  Review of Systems  No headache, no major weight loss or weight gain, no chest pain no back pain abdominal pain no change in bowel habits complete ROS otherwise negative     Objective:   Physical Exam  Alert vitals stable, NAD. Blood pressure good on repeat. HEENT normal. Lungs clear. Heart regular rate and rhythm. Some clearly swallowing positive small hematoma noted puncture wound noted      Assessment & Plan:  Impression puncture to the pulp of the thumb need to cover for secondary infection.  Discussed.  Also time for a tetanus shot will do Tdap

## 2019-01-17 ENCOUNTER — Encounter: Payer: Self-pay | Admitting: Family Medicine

## 2019-04-28 ENCOUNTER — Encounter: Payer: Self-pay | Admitting: Family Medicine

## 2019-05-26 ENCOUNTER — Other Ambulatory Visit: Payer: Self-pay | Admitting: Dermatology

## 2019-05-26 DIAGNOSIS — C4492 Squamous cell carcinoma of skin, unspecified: Secondary | ICD-10-CM

## 2019-05-26 HISTORY — DX: Squamous cell carcinoma of skin, unspecified: C44.92

## 2019-08-02 ENCOUNTER — Encounter: Payer: Self-pay | Admitting: Family Medicine

## 2019-08-02 ENCOUNTER — Ambulatory Visit (INDEPENDENT_AMBULATORY_CARE_PROVIDER_SITE_OTHER): Payer: Commercial Managed Care - PPO | Admitting: Family Medicine

## 2019-08-02 ENCOUNTER — Other Ambulatory Visit: Payer: Self-pay

## 2019-08-02 VITALS — BP 138/86 | Temp 97.3°F | Wt 198.4 lb

## 2019-08-02 DIAGNOSIS — N23 Unspecified renal colic: Secondary | ICD-10-CM

## 2019-08-02 LAB — POCT URINALYSIS DIPSTICK
Spec Grav, UA: 1.02 (ref 1.010–1.025)
pH, UA: 5 (ref 5.0–8.0)

## 2019-08-02 MED ORDER — DOXYCYCLINE HYCLATE 100 MG PO TABS: 100.0000 mg | ORAL_TABLET | Freq: Two times a day (BID) | ORAL | 0 refills | Status: DC

## 2019-08-02 MED ORDER — ONDANSETRON 4 MG PO TBDP: 4.0000 mg | ORAL_TABLET | Freq: Three times a day (TID) | ORAL | 0 refills | Status: DC | PRN

## 2019-08-02 NOTE — Progress Notes (Signed)
   Subjective:    Patient ID: Andrew Vargas, male    DOB: [birth year], 59 y.o.   MRN: [id number]  HPI Pt removed a tick for behind left knee 2 weeks ago. Pt began to experience fatigue, nausea, and back pain in kidney area on Friday. No issues using the bathroom. Back pain will be sharp at times but most times is dull. Pt has taken Tylenol to help.  Results for orders placed or performed in visit on 08/02/19  POCT urinalysis dipstick  Result Value Ref Range   Color, UA     Clarity, UA     Glucose, UA     Bilirubin, UA     Ketones, UA     Spec Grav, UA 1.020 1.010 - 1.025   Blood, UA     pH, UA 5.0 5.0 - 8.0   Protein, UA     Urobilinogen, UA     Nitrite, UA     Leukocytes, UA     Appearance     Odor      fri felt fatigued and nauseated  Felt queazzzy still, and noted low back pain   Sharp at times and aching  Similar symtoms Sunday   Takes tykeno and it seems to help   Took sine tyleno this morning   No trouble with the urinting stream maybe not as strong as usual     No cough cong   Some mild headache  Family wonder is  kdney stones and no chills Review of Systems See above    Objective:   Physical Exam  Alert vitals stable, NAD. Blood pressure good on repeat. HEENT normal. Lungs clear. Heart regular rate and rhythm.       Assessment & Plan:  Impression #1 back pain plus fatigue with negative urinalysis and no rash and malaise 1 week after tick bite.  Will cover for the potential of tickborne illness.  With doxycycline.  Also may be unrelated back strain discussed symptom care discussed

## 2019-12-20 ENCOUNTER — Encounter: Payer: Self-pay | Admitting: Dermatology

## 2019-12-20 ENCOUNTER — Other Ambulatory Visit: Payer: Self-pay

## 2019-12-20 ENCOUNTER — Ambulatory Visit (INDEPENDENT_AMBULATORY_CARE_PROVIDER_SITE_OTHER): Payer: Commercial Managed Care - PPO | Admitting: Dermatology

## 2019-12-20 DIAGNOSIS — D3610 Benign neoplasm of peripheral nerves and autonomic nervous system, unspecified: Secondary | ICD-10-CM

## 2019-12-20 DIAGNOSIS — D3613 Benign neoplasm of peripheral nerves and autonomic nervous system of lower limb, including hip: Secondary | ICD-10-CM | POA: Diagnosis not present

## 2019-12-20 DIAGNOSIS — D489 Neoplasm of uncertain behavior, unspecified: Secondary | ICD-10-CM

## 2019-12-20 DIAGNOSIS — L821 Other seborrheic keratosis: Secondary | ICD-10-CM | POA: Diagnosis not present

## 2019-12-20 DIAGNOSIS — Z85828 Personal history of other malignant neoplasm of skin: Secondary | ICD-10-CM

## 2019-12-20 DIAGNOSIS — D0439 Carcinoma in situ of skin of other parts of face: Secondary | ICD-10-CM

## 2019-12-20 DIAGNOSIS — Z1283 Encounter for screening for malignant neoplasm of skin: Secondary | ICD-10-CM | POA: Diagnosis not present

## 2019-12-20 DIAGNOSIS — L570 Actinic keratosis: Secondary | ICD-10-CM | POA: Diagnosis not present

## 2019-12-20 DIAGNOSIS — D045 Carcinoma in situ of skin of trunk: Secondary | ICD-10-CM | POA: Diagnosis not present

## 2019-12-20 NOTE — Patient Instructions (Addendum)
Routine follow-up for Andrew Vargas date of birth [birth year].  Dr. Nevada Crane had removed a spot from the V of the neck some years ago and there is a pink 9 mm heaped up crust adjacent to white scar which is probably a recurrent carcinoma.  Of chief concern was a smaller tan crust on the right infra tip of the nose not far from the Mohs surgery site.  Clinically this may just be an irritated keratosis but biopsy clearly indicated.  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  Hornlike precancers on the left forearm and the left temple were treated with 5-second freeze; these may swell and peel in the next 2 weeks.  There is no bandage or special care.  The dark brown spot on the right sideburn is the same as several similar spots on the chest.  These are benign keratoses and do not need to be frozen or biopsy.  On the right lowermost side is a soft 6 mm bubble which is probably a solitary neurofibroma and also harmless.  He does have minor diffuse sun damage on the crown of the scalp more than on the upper forehead.  We discussed using a cream called Tolak in the winter but will put that on the back burner for now.  All questions were answered.  Also did discuss Covid vaccinations but his wife has been working on him for some time and I will allow her to pick up the slack.    Biopsy, Surgery (Curettage) & Surgery (Excision) Aftercare Instructions  1. Okay to remove bandage in 24 hours  2. Wash area with soap and water  3. Apply Vaseline to area twice daily until healed (Not Neosporin)  4. Okay to cover with a Band-Aid to decrease the chance of infection or prevent irritation from clothing; also it's okay to uncover lesion at home.  5. Suture instructions: return to our office in 7-10 or 10-14 days for a nurse visit for suture removal. Variable healing with sutures, if pain or itching occurs call our office. It's okay to shower or bathe 24 hours after  sutures are given.  6. The following risks may occur after a biopsy, curettage or excision: bleeding, scarring, discoloration, recurrence, infection (redness, yellow drainage, pain or swelling).  7. For questions, concerns and results call our office at [place] before 4pm & Friday before 3pm. Biopsy results will be available in 1 week.

## 2019-12-24 ENCOUNTER — Telehealth: Payer: Self-pay | Admitting: *Deleted

## 2019-12-24 NOTE — Telephone Encounter (Signed)
Patient left message on office voice mail saying that he was returning our phone call.

## 2019-12-24 NOTE — Telephone Encounter (Signed)
-----   Message from Lavonna Monarch, MD sent at 12/24/2019  7:14 AM EDT ----- Schedule surgery with Dr. Darene Lamer

## 2019-12-24 NOTE — Telephone Encounter (Signed)
Left message for patient to call.

## 2019-12-27 ENCOUNTER — Encounter: Payer: Self-pay | Admitting: *Deleted

## 2019-12-27 NOTE — Telephone Encounter (Signed)
Pathology to patient, surgery scheduled  

## 2019-12-29 ENCOUNTER — Telehealth: Payer: Self-pay

## 2019-12-29 NOTE — Telephone Encounter (Signed)
Path to patient  Surgery made at visit 30

## 2019-12-29 NOTE — Telephone Encounter (Signed)
-----   Message from Lavonna Monarch, MD sent at 12/24/2019  7:14 AM EDT ----- Schedule surgery with Dr. Darene Lamer

## 2020-01-17 ENCOUNTER — Ambulatory Visit (INDEPENDENT_AMBULATORY_CARE_PROVIDER_SITE_OTHER): Payer: Commercial Managed Care - PPO

## 2020-01-17 ENCOUNTER — Ambulatory Visit (INDEPENDENT_AMBULATORY_CARE_PROVIDER_SITE_OTHER): Payer: Commercial Managed Care - PPO | Admitting: Podiatry

## 2020-01-17 ENCOUNTER — Other Ambulatory Visit: Payer: Self-pay

## 2020-01-17 DIAGNOSIS — M205X1 Other deformities of toe(s) (acquired), right foot: Secondary | ICD-10-CM

## 2020-01-17 DIAGNOSIS — M7751 Other enthesopathy of right foot: Secondary | ICD-10-CM | POA: Diagnosis not present

## 2020-01-17 MED ORDER — METHYLPREDNISOLONE 4 MG PO TBPK: ORAL_TABLET | ORAL | 0 refills | Status: DC

## 2020-01-17 MED ORDER — MELOXICAM 15 MG PO TABS: 15.0000 mg | ORAL_TABLET | Freq: Every day | ORAL | 1 refills | Status: DC

## 2020-01-17 NOTE — Progress Notes (Signed)
   HPI: 59 y.o. male presenting today as a reestablish new patient for evaluation of a new complaint regarding right great toe pain.  Patient states that the pain is been ongoing for the last few months.  He states that he change positions at his work and he is now standing 12-hour shifts.  He has had progressive pain to the right great toe and he does state that he does have an injury several years ago that healed uneventfully.  No new complaints at this time  Past Medical History:  Diagnosis Date  . Dyspnea   . ED (erectile dysfunction)   . GERD (gastroesophageal reflux disease)   . Hyperlipidemia   . SCC (squamous cell carcinoma) 05/26/2019   FRONT MID SCALP CX3 5FU  . Shoulder pain   . Squamous cell carcinoma of skin 06/10/2018   LEFT TIP OF THE NOSE MOHS  . Squamous cell carcinoma of skin 12/20/2019   in situ-mid upper chest  . Squamous cell carcinoma of skin 12/20/2019   in situ-right tip of nose  . Tobacco abuse      Physical Exam: General: The patient is alert and oriented x3 in no acute distress.  Dermatology: Skin is warm, dry and supple bilateral lower extremities. Negative for open lesions or macerations.  Vascular: Palpable pedal pulses bilaterally. No edema or erythema noted. Capillary refill within normal limits.  Neurological: Epicritic and protective threshold grossly intact bilaterally.   Musculoskeletal Exam: Range of motion within normal limits to all pedal and ankle joints bilateral. Muscle strength 5/5 in all groups bilateral.  Pain on palpation range of motion with crepitus noted to the first MPJ right  Radiographic Exam:  Normal osseous mineralization. Joint spaces preserved. No fracture/dislocation/boney destruction.  Degenerative changes noted with a bipartite sesamoid fibular sesamoid right first MTPJ.  Assessment: 1.  Hallux limitus/capsulitis first MTPJ right 2.  DJD right first MTPJ   Plan of Care:  1. Patient evaluated. X-Rays reviewed.  2.   Injection of 0.5 cc Celestone Soluspan injected into the right first MTPJ 3.  Prescription for Medrol Dosepak 4.  Prescription for meloxicam 15 mg daily to begin after completion of the Dosepak 5.  Today we did discuss custom molded orthotics as an option since the patient is on his feet for his entire shift at work 6.  Return to clinic as needed  *Wife is an Therapist, sports at [employer]; IV team. He works for a Software engineer for [employer].  Both are looking to retire in 3-1/2 years      Edrick Kins, DPM [facility]  Dr. Edrick Kins, DPM    [address], [place] [address]                Office [phone]  Fax [phone]

## 2020-01-19 NOTE — Progress Notes (Signed)
Follow-Up Visit   Subjective  Andrew Vargas is a 59 y.o. male who presents for the following: Annual Exam (LESION ON NOSE X MONTHS, ALSO FOREHEAD SPOTS COME AND GO).  Follow up Location:  Duration:  Quality:  Associated Signs/Symptoms: Modifying Factors:  Severity:  Timing: Context:   Objective  Well appearing patient in no apparent distress; mood and affect are within normal limits.  All skin waist up examined.   Routine follow-up for Andrew Vargas date of birth Jul 12, 1960.  Dr. Nevada Crane had removed a spot from the V of the neck some years ago and there is a pink 9 mm heaped up crust adjacent to white scar which is probably a recurrent carcinoma.  Of chief concern was a smaller tan crust on the right infra tip of the nose not far from the Mohs surgery site.  Clinically this may just be an irritated keratosis but biopsy clearly indicated.  Andrew Vargas on the biopsy results on his MyChart or by calling my office on Thursday.  Hornlike precancers on the left forearm and the left temple were treated with 5-second freeze; these may swell and peel in the next 2 weeks.  There is no bandage or special care.  The dark brown spot on the right sideburn is the same as several similar spots on the chest.  These are benign keratoses and do not need to be frozen or biopsy.  On the right lowermost side is a soft 6 mm bubble which is probably a solitary neurofibroma and also harmless.  He does have minor diffuse sun damage on the crown of the scalp more than on the upper forehead.  We discussed using a cream called Tolak in the winter but will put that on the back burner for now.  All questions were answered.  Also did discuss Covid vaccinations but his wife has been working on him for some time and I will allow her to pick up the slack.      Assessment & Plan    Neoplasm of uncertain behavior (2) Mid Upper Chest  Skin / nail biopsy Type of biopsy: tangential   Informed consent:  discussed and consent obtained   Timeout: patient name, date of birth, surgical site, and procedure verified   Anesthesia: the lesion was anesthetized in a standard fashion   Anesthetic:  1% lidocaine w/ epinephrine 1-100,000 local infiltration Instrument used: flexible razor blade   Hemostasis achieved with: ferric subsulfate   Outcome: patient tolerated procedure well   Post-procedure details: sterile dressing applied and wound care instructions given   Dressing type: bandage and petrolatum    Specimen 1 - Surgical pathology Differential Diagnosis: BCC SCC Vargas Margins: No  Right Tip of Nose  Skin / nail biopsy Type of biopsy: tangential   Informed consent: discussed and consent obtained   Timeout: patient name, date of birth, surgical site, and procedure verified   Anesthesia: the lesion was anesthetized in a standard fashion   Anesthetic:  1% lidocaine w/ epinephrine 1-100,000 local infiltration Instrument used: flexible razor blade   Hemostasis achieved with: ferric subsulfate   Outcome: patient tolerated procedure well   Post-procedure details: sterile dressing applied and wound care instructions given   Dressing type: bandage and petrolatum    Specimen 2 - Surgical pathology Differential Diagnosis: BCC SCC Vargas Margins: No  AK (actinic keratosis) (4) Left Forearm - Anterior; Mid Frontal Scalp; Mid Forehead; Left Temple  Patient will use tolak on the scalp  and forehead this fall /winter for treatment for sun damage spots.   Destruction of lesion - Left Forearm - Anterior, Left Temple Complexity: simple   Destruction method: cryotherapy   Informed consent: discussed and consent obtained   Lesion destroyed using liquid nitrogen: Yes   Cryotherapy cycles:  5 Outcome: patient tolerated procedure well with no complications    Seborrheic keratosis (2) Chest - Medial (Center); Right Preauricular Area  No treatment needed if no changes or bother to the patient.    Neurofibroma Left Hip  Harmless no treatment needed.      I, Lavonna Monarch, MD, have reviewed all documentation for this visit.  The documentation on 01/22/20 for the exam, diagnosis, procedures, and orders are all accurate and complete.

## 2020-01-22 ENCOUNTER — Encounter: Payer: Self-pay | Admitting: Dermatology

## 2020-02-23 ENCOUNTER — Ambulatory Visit (INDEPENDENT_AMBULATORY_CARE_PROVIDER_SITE_OTHER): Payer: Commercial Managed Care - PPO | Admitting: Podiatry

## 2020-02-23 ENCOUNTER — Other Ambulatory Visit: Payer: Self-pay

## 2020-02-23 DIAGNOSIS — M205X1 Other deformities of toe(s) (acquired), right foot: Secondary | ICD-10-CM

## 2020-02-23 DIAGNOSIS — G5792 Unspecified mononeuropathy of left lower limb: Secondary | ICD-10-CM

## 2020-02-23 DIAGNOSIS — M7751 Other enthesopathy of right foot: Secondary | ICD-10-CM | POA: Diagnosis not present

## 2020-02-23 MED ORDER — GABAPENTIN 100 MG PO CAPS: 100.0000 mg | ORAL_CAPSULE | Freq: Three times a day (TID) | ORAL | 0 refills | Status: DC

## 2020-02-23 NOTE — Progress Notes (Signed)
   HPI: 59 y.o. male presenting today for new complaint regarding numbness and tingling with burning sensation to the left second toe.  Patient states that his left second toe has been hurting approximately 2 days after he was last seen here in the office.  He began to experience burning and tingling sensation like his toe falls asleep.  Patient states it is constant throughout the day.  He denies injury or trauma or change in shoe gear.  He has not done anything for treatment. Patient states that his hallux limitus and capsulitis to the right first MTPJ has resolved.  He is doing very well with no pain associated to the right foot  Past Medical History:  Diagnosis Date  . Dyspnea   . ED (erectile dysfunction)   . GERD (gastroesophageal reflux disease)   . Hyperlipidemia   . SCC (squamous cell carcinoma) 05/26/2019   FRONT MID SCALP CX3 5FU  . Shoulder pain   . Squamous cell carcinoma of skin 06/10/2018   LEFT TIP OF THE NOSE MOHS  . Squamous cell carcinoma of skin 12/20/2019   in situ-mid upper chest  . Squamous cell carcinoma of skin 12/20/2019   in situ-right tip of nose  . Tobacco abuse      Physical Exam: General: The patient is alert and oriented x3 in no acute distress.  Dermatology: Skin is warm, dry and supple bilateral lower extremities. Negative for open lesions or macerations.  Vascular: Palpable pedal pulses bilaterally. No edema or erythema noted. Capillary refill within normal limits.  Neurological: Epicritic and protective threshold grossly intact bilaterally.  Paresthesia with pins-and-needles noted encompassing the entire left second toe  Musculoskeletal Exam: Range of motion within normal limits to all pedal and ankle joints bilateral. Muscle strength 5/5 in all groups bilateral.   Assessment: 1.  Neuritis left second toe 2.  Hallux limitus/DJD/capsulitis right first MTPJ-resolved   Plan of Care:  1. Patient evaluated.  2.  Prescription for gabapentin 100 mg  3 times daily for 30 days to see if this helps alleviate his neuritis to the left second toe 3.  Continue meloxicam as needed 4.  Return to clinic as needed  *Wife is an Therapist, sports at [employer]; IV team.  He works for a Software engineer for [employer].  Both are looking to retire in 3-1/2 years      Edrick Kins, DPM [facility]  Dr. Edrick Kins, DPM    [address], [place] [address]                Office [phone]  Fax [phone]

## 2020-03-09 ENCOUNTER — Other Ambulatory Visit: Payer: Self-pay

## 2020-03-09 ENCOUNTER — Encounter: Payer: Self-pay | Admitting: Dermatology

## 2020-03-09 ENCOUNTER — Ambulatory Visit (INDEPENDENT_AMBULATORY_CARE_PROVIDER_SITE_OTHER): Payer: Commercial Managed Care - PPO | Admitting: Dermatology

## 2020-03-09 DIAGNOSIS — D045 Carcinoma in situ of skin of trunk: Secondary | ICD-10-CM | POA: Diagnosis not present

## 2020-03-09 DIAGNOSIS — D0439 Carcinoma in situ of skin of other parts of face: Secondary | ICD-10-CM

## 2020-03-09 DIAGNOSIS — D099 Carcinoma in situ, unspecified: Secondary | ICD-10-CM

## 2020-03-09 NOTE — Progress Notes (Signed)
   Follow-Up Visit   Subjective  Andrew Vargas is a 59 y.o. male who presents for the following: Procedure (Here for treatment- mid upper chest & right tip of nose- cis x 2).  Skin cancers Location: Nasal tip in view of chest Duration:  Quality:  Associated Signs/Symptoms: Modifying Factors:  Severity:  Timing: Context: For treatment  Objective  Well appearing patient in no apparent distress; mood and affect are within normal limits. Objective  Mid Upper Chest: Biopsy site identified by patient and me  Objective  Right Tip of Nose: Biopsy site identified by patient and me   A focused examination was performed including Upper chest, neck, face.. Relevant physical exam findings are noted in the Assessment and Plan.   Assessment & Plan    Squamous cell carcinoma in situ (2) Mid Upper Chest  Destruction of lesion Complexity: simple   Destruction method: electrodesiccation and curettage   Informed consent: discussed and consent obtained   Timeout:  patient name, date of birth, surgical site, and procedure verified Anesthesia: the lesion was anesthetized in a standard fashion   Anesthetic:  1% lidocaine w/ epinephrine 1-100,000 local infiltration Curettage performed in three different directions: Yes   Curettage cycles:  3 Lesion length (cm):  1.5 Lesion width (cm):  1 Margin per side (cm):  0 Final wound size (cm):  1.5 Hemostasis achieved with:  ferric subsulfate Outcome: patient tolerated procedure well with no complications   Post-procedure details: sterile dressing applied and wound care instructions given   Dressing type: bandage and petrolatum   Additional details:  Wound innoculated with 5 fluorouracil solution.  Right Tip of Nose  Destruction of lesion Complexity: simple   Destruction method: electrodesiccation and curettage   Informed consent: discussed and consent obtained   Timeout:  patient name, date of birth, surgical site, and procedure  verified Anesthesia: the lesion was anesthetized in a standard fashion   Anesthetic:  1% lidocaine w/ epinephrine 1-100,000 local infiltration Curettage performed in three different directions: Yes   Curettage cycles:  3 Lesion length (cm):  1 Lesion width (cm):  1 Margin per side (cm):  0 Final wound size (cm):  1 Hemostasis achieved with:  ferric subsulfate Outcome: patient tolerated procedure well with no complications   Post-procedure details: sterile dressing applied and wound care instructions given   Dressing type: bandage and petrolatum   Additional details:  Wound innoculated with 5 fluorouracil solution.     I, Lavonna Monarch, MD, have reviewed all documentation for this visit.  The documentation on 03/09/20 for the exam, diagnosis, procedures, and orders are all accurate and complete.

## 2020-03-09 NOTE — Patient Instructions (Signed)

## 2020-09-22 ENCOUNTER — Other Ambulatory Visit: Payer: Self-pay | Admitting: Podiatry

## 2020-09-22 NOTE — Telephone Encounter (Signed)
Please advise 

## 2020-10-26 ENCOUNTER — Ambulatory Visit (INDEPENDENT_AMBULATORY_CARE_PROVIDER_SITE_OTHER): Payer: Commercial Managed Care - PPO | Admitting: Family Medicine

## 2020-10-26 ENCOUNTER — Other Ambulatory Visit: Payer: Self-pay

## 2020-10-26 VITALS — BP 128/86 | HR 74 | Ht 72.0 in | Wt 210.0 lb

## 2020-10-26 DIAGNOSIS — K5792 Diverticulitis of intestine, part unspecified, without perforation or abscess without bleeding: Secondary | ICD-10-CM

## 2020-10-26 MED ORDER — CIPROFLOXACIN HCL 500 MG PO TABS: 500.0000 mg | ORAL_TABLET | Freq: Two times a day (BID) | ORAL | 0 refills | Status: AC

## 2020-10-26 MED ORDER — METRONIDAZOLE 500 MG PO TABS: 500.0000 mg | ORAL_TABLET | Freq: Two times a day (BID) | ORAL | 0 refills | Status: DC

## 2020-10-26 NOTE — Progress Notes (Signed)
Patient ID: Andrew Vargas, male    DOB: 1961/01/21, 60 y.o.   MRN: [id number]   Chief Complaint  Patient presents with   Abdominal Pain    On left side for 3 weeks- like a dull pain or pressure   Subjective:    HPI Cc- abd pain. Abd Pressure on left side and 2-3/10  feeling after work after a full day.  Hurting for 3 weeks. Worse in evening. Left lower abd. Normal bm- going daily. No blood or black stool. No dysuria.  No blood in urine. No fever.  Colonoscopy- few yrs ago in 2020. Had polyps and diverticula were found on the last colonoscopy.  No concern of cancer.   Feeling nauea  Not problem eating.   Medical History Andrew Vargas has a past medical history of Dyspnea, ED (erectile dysfunction), GERD (gastroesophageal reflux disease), Hyperlipidemia, SCC (squamous cell carcinoma) (05/26/2019), Shoulder pain, Squamous cell carcinoma of skin (06/10/2018), Squamous cell carcinoma of skin (12/20/2019), Squamous cell carcinoma of skin (12/20/2019), and Tobacco abuse.   Outpatient Encounter Medications as of 10/26/2020  Medication Sig   [EXPIRED] ciprofloxacin (CIPRO) 500 MG tablet Take 1 tablet (500 mg total) by mouth 2 (two) times daily for 10 days.   metroNIDAZOLE (FLAGYL) 500 MG tablet Take 1 tablet (500 mg total) by mouth 2 (two) times daily.   gabapentin (NEURONTIN) 100 MG capsule Take 1 capsule (100 mg total) by mouth 3 (three) times daily. (Patient not taking: Reported on 10/26/2020)   meloxicam (MOBIC) 15 MG tablet TAKE 1 TABLET(15 MG) BY MOUTH DAILY   methylPREDNISolone (MEDROL DOSEPAK) 4 MG TBPK tablet 6 day dose pack - take as directed (Patient not taking: Reported on 03/09/2020)   ondansetron (ZOFRAN ODT) 4 MG disintegrating tablet Take 1 tablet (4 mg total) by mouth every 8 (eight) hours as needed for nausea or vomiting. (Patient not taking: Reported on 10/26/2020)   [DISCONTINUED] doxycycline (VIBRA-TABS) 100 MG tablet Take 1 tablet (100 mg total) by mouth 2 (two) times daily.  (Patient not taking: No sig reported)   No facility-administered encounter medications on file as of 10/26/2020.     Review of Systems  Constitutional:  Negative for chills and fever.  HENT:  Negative for congestion, rhinorrhea and sore throat.   Respiratory:  Negative for cough, shortness of breath and wheezing.   Cardiovascular:  Negative for chest pain and leg swelling.  Gastrointestinal:  Positive for abdominal pain and nausea. Negative for diarrhea and vomiting.  Genitourinary:  Negative for dysuria and frequency.  Skin:  Negative for rash.  Neurological:  Negative for dizziness, weakness and headaches.    Vitals BP 128/86   Pulse 74   Ht 6' (1.829 m)   Wt 210 lb (95.3 kg)   SpO2 98%   BMI 28.48 kg/m   Objective:   Physical Exam Vitals and nursing note reviewed.  Constitutional:      General: He is not in acute distress.    Appearance: Normal appearance. He is not ill-appearing.  HENT:     Head: Normocephalic.     Nose: Nose normal. No congestion.     Mouth/Throat:     Mouth: Mucous membranes are moist.     Pharynx: No oropharyngeal exudate.  Eyes:     Extraocular Movements: Extraocular movements intact.     Conjunctiva/sclera: Conjunctivae normal.     Pupils: Pupils are equal, round, and reactive to light.  Cardiovascular:     Rate and Rhythm: Normal rate and regular rhythm.  Pulses: Normal pulses.     Heart sounds: Normal heart sounds. No murmur heard. Pulmonary:     Effort: Pulmonary effort is normal.     Breath sounds: Normal breath sounds. No wheezing, rhonchi or rales.  Abdominal:     General: Abdomen is flat. Bowel sounds are normal. There is no distension.     Palpations: Abdomen is soft. There is no shifting dullness, fluid wave or hepatomegaly.     Tenderness: There is abdominal tenderness in the left lower quadrant. There is no guarding or rebound. Negative signs include Murphy's sign and McBurney's sign.  Musculoskeletal:        General: Normal  range of motion.     Right lower leg: No edema.     Left lower leg: No edema.  Skin:    General: Skin is warm and dry.     Findings: No rash.  Neurological:     General: No focal deficit present.     Mental Status: He is alert and oriented to person, place, and time.     Cranial Nerves: No cranial nerve deficit.  Psychiatric:        Mood and Affect: Mood normal.        Behavior: Behavior normal.        Thought Content: Thought content normal.        Judgment: Judgment normal.     Assessment and Plan   1. Diverticulitis - ciprofloxacin (CIPRO) 500 MG tablet; Take 1 tablet (500 mg total) by mouth 2 (two) times daily for 10 days.  Dispense: 20 tablet; Refill: 0 - metroNIDAZOLE (FLAGYL) 500 MG tablet; Take 1 tablet (500 mg total) by mouth 2 (two) times daily.  Dispense: 20 tablet; Refill: 0   Will treat empirically for diverticulitis. Pt to call if having fever, worsening pain, bloody or mucous stools.  Return if symptoms worsen or fail to improve.

## 2020-12-07 ENCOUNTER — Telehealth: Payer: Self-pay | Admitting: Family Medicine

## 2020-12-07 ENCOUNTER — Telehealth: Payer: Self-pay | Admitting: Gastroenterology

## 2020-12-07 NOTE — Telephone Encounter (Signed)
Pt contacted. Pt states he is still having same type of symptoms. LLQ ache that is dull and nauseated feeling. Advised pt that we recommend Urgent Care due to Dr.Taylor leaving and Dr.Scott has no appts. Pt verbalized understanding.

## 2020-12-07 NOTE — Telephone Encounter (Signed)
Pt is needing an appointment to be seen for diverticulitis. Pt has not been seen since 2020.

## 2020-12-07 NOTE — Telephone Encounter (Signed)
CB# [phone]  Patient requesting refill on his medicine for Diverticulitis. He states that he has finished the medication that he was given last month and it worked really well but he feels like he is having the same issue.

## 2021-01-11 ENCOUNTER — Ambulatory Visit (HOSPITAL_COMMUNITY)
Admission: RE | Admit: 2021-01-11 | Discharge: 2021-01-11 | Disposition: A | Discharge status: Home / Self Care | Payer: Commercial Managed Care - PPO | Source: Ambulatory Visit | Attending: Family Medicine | Admitting: Family Medicine

## 2021-01-11 ENCOUNTER — Other Ambulatory Visit: Payer: Self-pay

## 2021-01-11 ENCOUNTER — Ambulatory Visit (INDEPENDENT_AMBULATORY_CARE_PROVIDER_SITE_OTHER): Payer: Commercial Managed Care - PPO | Admitting: Family Medicine

## 2021-01-11 ENCOUNTER — Other Ambulatory Visit (HOSPITAL_COMMUNITY)
Admission: RE | Admit: 2021-01-11 | Discharge: 2021-01-11 | Disposition: A | Discharge status: Home / Self Care | Payer: Commercial Managed Care - PPO | Source: Ambulatory Visit | Attending: Family Medicine | Admitting: Family Medicine

## 2021-01-11 ENCOUNTER — Telehealth: Payer: Self-pay | Admitting: Family Medicine

## 2021-01-11 VITALS — BP 128/85 | HR 85 | Temp 98.1°F | Ht 72.0 in | Wt 215.0 lb

## 2021-01-11 DIAGNOSIS — R1032 Left lower quadrant pain: Secondary | ICD-10-CM | POA: Diagnosis not present

## 2021-01-11 DIAGNOSIS — K5732 Diverticulitis of large intestine without perforation or abscess without bleeding: Secondary | ICD-10-CM

## 2021-01-11 LAB — COMPREHENSIVE METABOLIC PANEL
ALT: 29 U/L (ref 0–44)
AST: 23 U/L (ref 15–41)
Albumin: 4.1 g/dL (ref 3.5–5.0)
Alkaline Phosphatase: 63 U/L (ref 38–126)
Anion gap: 10 (ref 5–15)
BUN: 15 mg/dL (ref 6–20)
CO2: 23 mmol/L (ref 22–32)
Calcium: 8.4 mg/dL — ABNORMAL LOW (ref 8.9–10.3)
Chloride: 103 mmol/L (ref 98–111)
Creatinine, Ser: 0.83 mg/dL (ref 0.61–1.24)
GFR, Estimated: >60 mL/min (ref >60)
Glucose, Bld: 84 mg/dL (ref 70–99)
Potassium: 3.6 mmol/L (ref 3.5–5.1)
Sodium: 136 mmol/L (ref 135–145)
Total Bilirubin: 0.3 mg/dL (ref 0.3–1.2)
Total Protein: 7.4 g/dL (ref 6.5–8.1)

## 2021-01-11 LAB — CBC
HCT: 49.5 % (ref 39.0–52.0)
Hemoglobin: 16.7 g/dL (ref 13.0–17.0)
MCH: 29.3 pg (ref 26.0–34.0)
MCHC: 33.7 g/dL (ref 30.0–36.0)
MCV: 87 fL (ref 80.0–100.0)
Platelets: 201 10*3/uL (ref 150–400)
RBC: 5.69 MIL/uL (ref 4.22–5.81)
RDW: 14.7 % (ref 11.5–15.5)
WBC: 8 10*3/uL (ref 4.0–10.5)
nRBC: 0 % (ref 0.0–0.2)

## 2021-01-11 MED ORDER — IOHEXOL 300 MG/ML  SOLN
100.0000 mL | Freq: Once | INTRAMUSCULAR | Status: AC | PRN
  Administered 2021-01-11: 100 mL via INTRAVENOUS

## 2021-01-11 NOTE — Progress Notes (Addendum)
Subjective:  Patient ID: Andrew Vargas, male    DOB: 1961-03-24  Age: 60 y.o. MRN: [id number]  CC: Chief Complaint  Patient presents with   left side ABD pain     Follow up , nausea at times - hx of diverticulitis    HPI:  60 year old male presents with abdominal pain.  Previously seen on 8/4. Treated for suspected diverticulitis with Cipro and Flagyl. States that he improved but then pain recurred approximately 1 month ago.  Patient continues to have intermittent left lower quadrant pain.  Associated nausea.  No vomiting.  Does endorse some constipation. No diarrhea. No relieving factors. No known exacerbating factors.  Patient Active Problem List   Diagnosis Date Noted   LLQ abdominal pain 01/11/2021   History of colonic polyps 06/05/2018   Prediabetes 06/18/2017   Erectile dysfunction 04/27/2013   Pain in joint, shoulder region 12/23/2011   Muscle weakness (generalized) 12/23/2011   Adhesive capsulitis of right shoulder 12/23/2011   Tubular adenoma 11/07/2011   GERD (gastroesophageal reflux disease) 11/07/2011    Social Hx   Social History   Socioeconomic History   Marital status: Married    Spouse name: Not on file   Number of children: Not on file   Years of education: Not on file   Highest education level: Not on file  Occupational History   Occupation: full time    Employer: [employer]  Tobacco Use   Smoking status: Every Day    Packs/day: 1.00    Types: Cigarettes   Smokeless tobacco: Never  Vaping Use   Vaping Use: Never used  Substance and Sexual Activity   Alcohol use: Yes    Alcohol/week: 0.0 standard drinks    Comment: socially   Drug use: No   Sexual activity: Not on file  Other Topics Concern   Not on file  Social History Narrative   Not on file   Social Determinants of Health   Financial Resource Strain: Not on file  Food Insecurity: Not on file  Transportation Needs: Not on file  Physical Activity: Not on file  Stress: Not on  file  Social Connections: Not on file    Review of Systems  Constitutional:  Negative for fever.  Gastrointestinal:  Positive for abdominal pain, constipation and nausea. Negative for diarrhea and vomiting.    Objective:  BP 128/85   Pulse 85   Temp 98.1 F (36.7 C)   Ht 6' (1.829 m)   Wt 215 lb (97.5 kg)   SpO2 98%   BMI 29.16 kg/m   BP/Weight 01/11/2021 10/26/2020 3/87/5643  Systolic BP 329 518 841  Diastolic BP 85 86 86  Wt. (Lbs) 215 210 198.4  BMI 29.16 28.48 26.91    Physical Exam Constitutional:      General: He is not in acute distress.    Appearance: Normal appearance. He is not ill-appearing.  HENT:     Head: Normocephalic and atraumatic.  Cardiovascular:     Rate and Rhythm: Normal rate and regular rhythm.     Heart sounds: No murmur heard. Pulmonary:     Effort: Pulmonary effort is normal.     Breath sounds: Normal breath sounds. No wheezing, rhonchi or rales.  Abdominal:     General: There is no distension.     Palpations: Abdomen is soft.     Comments: LLQ tenderness to palpation. Hyperactive bowel sounds.  Neurological:     Mental Status: He is alert.  Lab Results  Component Value Date   WBC 8.0 01/11/2021   HGB 16.7 01/11/2021   HCT 49.5 01/11/2021   PLT 201 01/11/2021   GLUCOSE 84 01/11/2021   CHOL 214 (H) 06/09/2017   TRIG 109 06/09/2017   HDL 46 06/09/2017   LDLCALC 146 (H) 06/09/2017   ALT 29 01/11/2021   AST 23 01/11/2021   NA 136 01/11/2021   K 3.6 01/11/2021   CL 103 01/11/2021   CREATININE 0.83 01/11/2021   BUN 15 01/11/2021   CO2 23 01/11/2021   TSH 1.730 01/15/2016   PSA 1.08 05/03/2013     Assessment & Plan:   Problem List Items Addressed This Visit       Other   LLQ abdominal pain - Primary    Unremarkable CBC and CMP today. CT revealed uncomplicated sigmoid diverticulitis. Has not improved with conservative care. Also, this is 2nd bout since August. Starting on Augmentin. If persists, will need inpatient  treatment. If recurs will refer to Gen Surg.      Relevant Orders   CBC   Comprehensive metabolic panel   CT Abdomen Pelvis W Contrast (Completed)   Other Visit Diagnoses     Sigmoid diverticulitis       Relevant Medications   amoxicillin-clavulanate (AUGMENTIN) 875-125 MG tablet      Follow-up:  1st of the year for a physical; sooner if needed.   [place]

## 2021-01-11 NOTE — Telephone Encounter (Signed)
Left message to return call 

## 2021-01-11 NOTE — Telephone Encounter (Signed)
Patient was seen today and was asked to get the flu shot but did not receive it. Just wanted to make sure it was not on his chart. He will possibly be getting it at work.   [phone]

## 2021-01-11 NOTE — Assessment & Plan Note (Addendum)
Unremarkable CBC and CMP today. CT revealed uncomplicated sigmoid diverticulitis. Has not improved with conservative care. Also, this is 2nd bout since August. Starting on Augmentin. If persists, will need inpatient treatment. If recurs will refer to Gen Surg.

## 2021-01-11 NOTE — Patient Instructions (Signed)
Labs today.  Awaiting scheduling of CT.  I will call once I have everything to review.  Take care  Dr. Lacinda Axon

## 2021-01-12 MED ORDER — AMOXICILLIN-POT CLAVULANATE 875-125 MG PO TABS: 1.0000 | ORAL_TABLET | Freq: Two times a day (BID) | ORAL | 0 refills | Status: AC

## 2021-01-12 NOTE — Telephone Encounter (Signed)
Pt returned call and was informed that flu shot was not on chart.

## 2021-01-12 NOTE — Addendum Note (Signed)
Addended by: Coral Spikes on: 01/12/2021 08:24 AM   Modules accepted: Orders

## 2021-01-30 ENCOUNTER — Other Ambulatory Visit: Payer: Self-pay

## 2021-01-30 ENCOUNTER — Ambulatory Visit (INDEPENDENT_AMBULATORY_CARE_PROVIDER_SITE_OTHER): Payer: Commercial Managed Care - PPO | Admitting: Internal Medicine

## 2021-01-30 ENCOUNTER — Telehealth: Payer: Self-pay

## 2021-01-30 ENCOUNTER — Encounter: Payer: Self-pay | Admitting: Internal Medicine

## 2021-01-30 VITALS — BP 119/78 | HR 84 | Temp 97.7°F | Ht 72.0 in | Wt 215.8 lb

## 2021-01-30 DIAGNOSIS — K5732 Diverticulitis of large intestine without perforation or abscess without bleeding: Secondary | ICD-10-CM | POA: Diagnosis not present

## 2021-01-30 DIAGNOSIS — F325 Major depressive disorder, single episode, in full remission: Secondary | ICD-10-CM | POA: Insufficient documentation

## 2021-01-30 DIAGNOSIS — K219 Gastro-esophageal reflux disease without esophagitis: Secondary | ICD-10-CM | POA: Diagnosis not present

## 2021-01-30 MED ORDER — PANTOPRAZOLE SODIUM 40 MG PO TBEC: 40.0000 mg | DELAYED_RELEASE_TABLET | Freq: Every day | ORAL | 11 refills | Status: DC

## 2021-01-30 MED ORDER — PEG 3350-KCL-NA BICARB-NACL 420 G PO SOLR: 4000.0000 mL | ORAL | 0 refills | Status: DC

## 2021-01-30 NOTE — Telephone Encounter (Signed)
PA for TCS/EGD submitted via [facility] website. Case ID: [id number]. Case pending.

## 2021-01-30 NOTE — Patient Instructions (Signed)
Good to see you again today!  As discussed, you most likely had a recent bout of uncomplicated diverticulitis.  However, because of CT abnormality and your history of colon polyps a colonoscopy is indicated.  In addition, you have longstanding acid reflux symptoms and have never had your upper GI tract looked at.  As discussed, we will plan to look at your esophagus via upper endoscopy at the same time as colonoscopy.  We will schedule a diagnostic EGD and colonoscopy (ASA 2/propofol).  We will schedule next month between December 15 and 31  Begin Benefiber 1 tablespoon daily x3 weeks; then increase to 2 tablespoons every day thereafter  Begin Protonix or pantoprazole 40 mg daily before breakfast for acid reflux.  Dispense 30 with 11 refills  Information on GERD and diverticulitis/diverticulosis provided  Further recommendations to follow.

## 2021-01-30 NOTE — Progress Notes (Signed)
Primary Care Physician:  Coral Spikes, DO Primary Gastroenterologist:  Dr. Gala Romney  Pre-Procedure History & Physical: HPI:  Andrew Vargas is a 60 y.o. male here for further evaluation of first-ever bout of left lower quadrant abdominal pain.  Had pain started acutely and lasted for couple of weeks.  Saw his new PCP who obtained a CT scan which revealed segmental thickening of the sigmoid colon most consistent with diverticulitis.  Also small hiatal hernia.  He was given a course of Augmentin.  Abdominal pain resolved.  He now describes simply a vague pressure left lower quadrant but denies pain.  Has not had any bowel dysfunction along the way.  No nausea or vomiting.  Denies hematochezia or melena.  He has a personal history of multiple colonic polyps removed over multiple colonoscopies.;  He is due for surveillance colonoscopy mid 2023.  As a separate issue, he describes frequent heartburn symptoms not taking any medication.  No dysphagia.  Has more than 3 episodes weekly and has had significant heartburn for decades.  No prior EGD.  At this time, he is on no prescribed medications.  Past Medical History:  Diagnosis Date   Dyspnea    ED (erectile dysfunction)    GERD (gastroesophageal reflux disease)    Hyperlipidemia    SCC (squamous cell carcinoma) 05/26/2019   FRONT MID SCALP CX3 5FU   Shoulder pain    Squamous cell carcinoma of skin 06/10/2018   LEFT TIP OF THE NOSE MOHS   Squamous cell carcinoma of skin 12/20/2019   in situ-mid upper chest   Squamous cell carcinoma of skin 12/20/2019   in situ-right tip of nose   Tobacco abuse     Past Surgical History:  Procedure Laterality Date   COLONOSCOPY  05/02/2006   Normal rectum/Normal colon   COLONOSCOPY  10/22/99   tubular adenoma   COLONOSCOPY  11/21/2011   Dr. Gala Romney: 1 by 1.25 cm hyperplastic carpet polyp on distal side of IC valve   COLONOSCOPY WITH PROPOFOL N/A 09/21/2018   Procedure: COLONOSCOPY WITH PROPOFOL;  Surgeon:  Daneil Dolin, MD;  Location: [facility];  Service: Endoscopy;  Laterality: N/A;  1:00pm   embryonal cyst     removed from throat   POLYPECTOMY  09/21/2018   Procedure: POLYPECTOMY;  Surgeon: Daneil Dolin, MD;  Location: [facility];  Service: Endoscopy;;  colon   UMBILICAL HERNIA REPAIR     as a baby    Prior to Admission medications   Not on File    Allergies as of 01/30/2021   (No Known Allergies)    Family History  Problem Relation Age of Onset   Colon cancer Neg Hx     Social History   Socioeconomic History   Marital status: Married    Spouse name: Not on file   Number of children: Not on file   Years of education: Not on file   Highest education level: Not on file  Occupational History   Occupation: full time    Employer: [employer]  Tobacco Use   Smoking status: Every Day    Packs/day: 1.00    Types: Cigarettes   Smokeless tobacco: Never  Vaping Use   Vaping Use: Never used  Substance and Sexual Activity   Alcohol use: Yes    Alcohol/week: 0.0 standard drinks    Comment: socially   Drug use: No   Sexual activity: Not on file  Other Topics Concern   Not  on file  Social History Narrative   Not on file   Social Determinants of Health   Financial Resource Strain: Not on file  Food Insecurity: Not on file  Transportation Needs: Not on file  Physical Activity: Not on file  Stress: Not on file  Social Connections: Not on file  Intimate Partner Violence: Not on file    Review of Systems: See HPI, otherwise negative ROS  Physical Exam: BP 119/78   Pulse 84   Temp 97.7 F (36.5 C)   Ht 6' (1.829 m)   Wt 215 lb 12.8 oz (97.9 kg)   BMI 29.27 kg/m  General:   Alert,  Well-developed, well-nourished, pleasant and cooperative in NAD Neck:  Supple; no masses or thyromegaly. No significant cervical adenopathy. Lungs:  Clear throughout to auscultation.   No wheezes, crackles, or rhonchi. No acute distress. Heart:  Regular rate and  rhythm; no murmurs, clicks, rubs,  or gallops. Abdomen: Non-distended, normal bowel sounds.  Soft and nontender without appreciable mass or hepatosplenomegaly.  Pulses:  Normal pulses noted. Extremities:  Without clubbing or edema.  Impression/Plan: 60 year old gentleman with left lower quadrant abdominal pain in all likelihood secondary to uncomplicated sigmoid diverticulitis.  He has essentially recovered with a course of Augmentin.  Most likely CT abnormality is related to diverticulitis but given the abnormality and the fact he had multiple colonic polyps over time and he is due for colonoscopy in the near future anyway, I feel we ought to go ahead and plan to examine his colon in the near future. We talked about the potential recurrent nature diverticular colitis.  I explained to him there is no way to prevent future episodes of diverticulitis.  Should he have recurrent episodes of diverticulitis in the future, we might need to consider surgical consultation for segmental resection but that his approach is not needed at this time.  As a separate issue, he has had essentially lifelong reflux symptoms.  He has almost daily symptoms without dysphagia or other alarm features.  He is not on acid suppression therapy.  He needs a one-time look in his upper GI tract to assess for complicated GERD i.e. Barrett's esophagus etc.  Recommendations:   We will schedule a diagnostic EGD and colonoscopy (ASA 2/propofol)  next month between December 15 and 31.  The risks, benefits, limitations, imponderables and alternatives regarding both EGD and colonoscopy have been reviewed with the patient. Questions have been answered. All parties agreeable.    Begin Benefiber 1 tablespoon daily x3 weeks; then increase to 2 tablespoons every day thereafter  Begin Protonix or pantoprazole 40 mg daily before breakfast for acid reflux.  Dispense 30 with 11 refills  Information on GERD and diverticulitis/diverticulosis  provided  Further recommendations to follow.   Notice: This dictation was prepared with Dragon dictation along with smaller phrase technology. Any transcriptional errors that result from this process are unintentional and may not be corrected upon review.

## 2021-01-30 NOTE — Telephone Encounter (Signed)
Per AVS, pt needs TCS/EGD 03/08/21-03/24/21. No availability during this time on Dr. Roseanne Kaufman schedule for Propofol ASA 2. Pt requesting procedure before end of year d/t insurance deductible. Spoke to Dr. Gala Romney, ok for procedure 03/05/21. Called and informed pt Dr. Gala Romney ok with him being scheduled 03/05/21. Rx for prep sent to pharmacy. Orders entered. Instructions mailed.

## 2021-01-31 ENCOUNTER — Other Ambulatory Visit: Payer: Self-pay

## 2021-01-31 NOTE — Telephone Encounter (Signed)
No PA required for TCS/EGD per [facility].

## 2021-03-05 ENCOUNTER — Encounter (HOSPITAL_COMMUNITY)
Admission: RE | Disposition: A | Discharge status: Home / Self Care | Payer: Self-pay | Source: Home / Self Care | Attending: Internal Medicine

## 2021-03-05 ENCOUNTER — Ambulatory Visit (HOSPITAL_COMMUNITY): Payer: Commercial Managed Care - PPO | Admitting: Anesthesiology

## 2021-03-05 ENCOUNTER — Encounter (HOSPITAL_COMMUNITY): Payer: Self-pay | Admitting: Internal Medicine

## 2021-03-05 ENCOUNTER — Other Ambulatory Visit: Payer: Self-pay

## 2021-03-05 ENCOUNTER — Ambulatory Visit (HOSPITAL_COMMUNITY)
Admission: RE | Admit: 2021-03-05 | Discharge: 2021-03-05 | Disposition: A | Discharge status: Home / Self Care | Payer: Commercial Managed Care - PPO | Attending: Internal Medicine | Admitting: Internal Medicine

## 2021-03-05 DIAGNOSIS — K219 Gastro-esophageal reflux disease without esophagitis: Secondary | ICD-10-CM | POA: Insufficient documentation

## 2021-03-05 DIAGNOSIS — R12 Heartburn: Secondary | ICD-10-CM | POA: Insufficient documentation

## 2021-03-05 DIAGNOSIS — K449 Diaphragmatic hernia without obstruction or gangrene: Secondary | ICD-10-CM | POA: Insufficient documentation

## 2021-03-05 DIAGNOSIS — F17200 Nicotine dependence, unspecified, uncomplicated: Secondary | ICD-10-CM | POA: Diagnosis not present

## 2021-03-05 DIAGNOSIS — D120 Benign neoplasm of cecum: Secondary | ICD-10-CM | POA: Diagnosis not present

## 2021-03-05 DIAGNOSIS — R933 Abnormal findings on diagnostic imaging of other parts of digestive tract: Secondary | ICD-10-CM | POA: Diagnosis present

## 2021-03-05 DIAGNOSIS — K319 Disease of stomach and duodenum, unspecified: Secondary | ICD-10-CM | POA: Diagnosis not present

## 2021-03-05 DIAGNOSIS — Z79899 Other long term (current) drug therapy: Secondary | ICD-10-CM | POA: Diagnosis not present

## 2021-03-05 DIAGNOSIS — D125 Benign neoplasm of sigmoid colon: Secondary | ICD-10-CM | POA: Insufficient documentation

## 2021-03-05 HISTORY — PX: COLONOSCOPY WITH PROPOFOL: SHX5780

## 2021-03-05 HISTORY — PX: BIOPSY: SHX5522

## 2021-03-05 HISTORY — PX: ESOPHAGOGASTRODUODENOSCOPY (EGD) WITH PROPOFOL: SHX5813

## 2021-03-05 HISTORY — PX: POLYPECTOMY: SHX5525

## 2021-03-05 SURGERY — COLONOSCOPY WITH PROPOFOL
Anesthesia: General

## 2021-03-05 MED ORDER — STERILE WATER FOR IRRIGATION IR SOLN
Status: DC | PRN
  Administered 2021-03-05: 180 mL

## 2021-03-05 MED ORDER — PROPOFOL 10 MG/ML IV BOLUS
INTRAVENOUS | Status: DC | PRN
  Administered 2021-03-05: 100 mg via INTRAVENOUS

## 2021-03-05 MED ORDER — PROPOFOL 500 MG/50ML IV EMUL
INTRAVENOUS | Status: DC | PRN
  Administered 2021-03-05: 150 ug/kg/min via INTRAVENOUS

## 2021-03-05 MED ORDER — LIDOCAINE HCL (CARDIAC) PF 100 MG/5ML IV SOSY
PREFILLED_SYRINGE | INTRAVENOUS | Status: DC | PRN
  Administered 2021-03-05: 50 mg via INTRAVENOUS

## 2021-03-05 MED ORDER — LACTATED RINGERS IV SOLN: INTRAVENOUS | Status: DC

## 2021-03-05 NOTE — Anesthesia Preprocedure Evaluation (Addendum)
Anesthesia Evaluation  Patient identified by MRN, date of birth, ID band Patient awake    Reviewed: Allergy & Precautions, H&P , NPO status , Patient's Chart, lab work & pertinent test results  Airway Mallampati: II  TM Distance: >3 FB Neck ROM: Full    Dental  (+) Dental Advisory Given Crowns :   Pulmonary shortness of breath, Current SmokerPatient did not abstain from smoking.,    Pulmonary exam normal breath sounds clear to auscultation       Cardiovascular Exercise Tolerance: Good negative cardio ROS Normal cardiovascular exam Rhythm:Regular Rate:Normal     Neuro/Psych PSYCHIATRIC DISORDERS Anxiety Depression negative neurological ROS     GI/Hepatic Neg liver ROS, GERD  Medicated and Controlled,  Endo/Other  negative endocrine ROS  Renal/GU negative Renal ROS  negative genitourinary   Musculoskeletal  (+) Arthritis ,   Abdominal   Peds negative pediatric ROS (+)  Hematology negative hematology ROS (+)   Anesthesia Other Findings   Reproductive/Obstetrics negative OB ROS                            Anesthesia Physical Anesthesia Plan  ASA: 2  Anesthesia Plan: General   Post-op Pain Management: Minimal or no pain anticipated   Induction:   PONV Risk Score and Plan: TIVA  Airway Management Planned: Nasal Cannula and Natural Airway  Additional Equipment:   Intra-op Plan:   Post-operative Plan:   Informed Consent: I have reviewed the patients History and Physical, chart, labs and discussed the procedure including the risks, benefits and alternatives for the proposed anesthesia with the patient or authorized representative who has indicated his/her understanding and acceptance.     Dental advisory given  Plan Discussed with: CRNA and Surgeon  Anesthesia Plan Comments:         Anesthesia Quick Evaluation

## 2021-03-05 NOTE — Op Note (Signed)
[facility] Patient Name: Andrew Vargas Procedure Date: 03/05/2021 9:46 AM MRN: [id number] Date of Birth: 1960/10/19 Attending MD: Norvel Richards , MD CSN: [id number] Age: 60 Admit Type: Outpatient Procedure:                Upper GI endoscopy Indications:              Heartburn Providers:                Norvel Richards, MD, Rosina Lowenstein, RN, Casimer Bilis, Technician Referring MD:              Medicines:                Propofol per Anesthesia Complications:            No immediate complications. Estimated Blood Loss:     Estimated blood loss was minimal. Procedure:                Pre-Anesthesia Assessment:                           - Prior to the procedure, a History and Physical                            was performed, and patient medications and                            allergies were reviewed. The patient's tolerance of                            previous anesthesia was also reviewed. The risks                            and benefits of the procedure and the sedation                            options and risks were discussed with the patient.                            All questions were answered, and informed consent                            was obtained. Prior Anticoagulants: The patient has                            taken no previous anticoagulant or antiplatelet                            agents. ASA Grade Assessment: II - A patient with                            mild systemic disease. After reviewing the risks  and benefits, the patient was deemed in                            satisfactory condition to undergo the procedure.                           After obtaining informed consent, the endoscope was                            passed under direct vision. Throughout the                            procedure, the patient's blood pressure, pulse, and                            oxygen saturations  were monitored continuously. The                            [id number]-H190 ([id number]) scope was introduced through the                            mouth, and advanced to the second part of duodenum.                            The upper GI endoscopy was accomplished without                            difficulty. The patient tolerated the procedure                            well. Scope In: 10:02:27 AM Scope Out: 10:08:58 AM Total Procedure Duration: 0 hours 6 minutes 31 seconds  Findings:      The examined esophagus was normal aside from a slightly undulating       Z-line. No esophagitis or Barrett's epithelium same      A small hiatal hernia was present. Gastric mucosa appeared normal.       Patent pylorus.      Slight scalloping of the second and third portion duodenal mucosa.       Biopsies of the duodenal mucosa taken for histologic study. Impression:               - Normal esophagus.                           - Small hiatal hernia.                           -Subtly abnormal appearing duodenum of uncertain                            significance. Status post biopsy. Moderate Sedation:      Moderate (conscious) sedation was personally administered by an       anesthesia professional. The following parameters were monitored: oxygen       saturation, heart rate, blood pressure, respiratory rate, EKG, adequacy  of pulmonary ventilation, and response to care. Recommendation:           - Patient has a contact number available for                            emergencies. The signs and symptoms of potential                            delayed complications were discussed with the                            patient. Return to normal activities tomorrow.                            Written discharge instructions were provided to the                            patient.                           - Advance diet as tolerated. Continue Protonix 40                            mg daily. Further  recommendations to follow pending                            review of pathology report see colonoscopy report. Procedure Code(s):        --- Professional ---                           [phone], Esophagogastroduodenoscopy, flexible,                            transoral; diagnostic, including collection of                            specimen(s) by brushing or washing, when performed                            (separate procedure) Diagnosis Code(s):        --- Professional ---                           K44.9, Diaphragmatic hernia without obstruction or                            gangrene                           R12, Heartburn CPT copyright 2019 [employer]. All rights reserved. The codes documented in this report are preliminary and upon coder review may  be revised to meet current compliance requirements. [name]. [name], MD Norvel Richards, MD 03/05/2021 10:42:25 AM This report has been signed electronically. Number of Addenda: 0

## 2021-03-05 NOTE — Op Note (Signed)
[facility] Patient Name: Andrew Vargas Procedure Date: 03/05/2021 9:45 AM MRN: [id number] Date of Birth: [birth year] Attending MD: Norvel Richards , MD CSN: [id number] Age: 60 Admit Type: Outpatient Procedure:                Colonoscopy Indications:              Abnormal CT of the GI tract Providers:                Norvel Richards, MD, Rosina Lowenstein, RN, Wynonia Musty Tech, Technician Referring MD:              Medicines:                Propofol per Anesthesia Complications:            No immediate complications. Estimated Blood Loss:     Estimated blood loss was minimal. Procedure:                Pre-Anesthesia Assessment:                           - Prior to the procedure, a History and Physical                            was performed, and patient medications and                            allergies were reviewed. The patient's tolerance of                            previous anesthesia was also reviewed. The risks                            and benefits of the procedure and the sedation                            options and risks were discussed with the patient.                            All questions were answered, and informed consent                            was obtained. Prior Anticoagulants: The patient has                            taken no previous anticoagulant or antiplatelet                            agents. ASA Grade Assessment: II - A patient with                            mild systemic disease. After reviewing the risks  and benefits, the patient was deemed in                            satisfactory condition to undergo the procedure.                           After obtaining informed consent, the colonoscope                            was passed under direct vision. Throughout the                            procedure, the patient's blood pressure, pulse, and                            oxygen  saturations were monitored continuously. The                            [id number]) scope was introduced through the                            anus and advanced to the the cecum, identified by                            appendiceal orifice and ileocecal valve. The                            colonoscopy was performed without difficulty. The                            patient tolerated the procedure well. The quality                            of the bowel preparation was adequate. Scope In: 10:14:40 AM Scope Out: 10:31:56 AM Scope Withdrawal Time: 0 hours 11 minutes 1 second  Total Procedure Duration: 0 hours 17 minutes 16 seconds  Findings:      The perianal and digital rectal examinations were normal.      A 2 mm polyp was found in the cecum. The polyp was sessile. The polyp       was removed with a cold biopsy forceps. Resection and retrieval were       complete. Estimated blood loss was minimal.      A 4 mm polyp was found in the sigmoid colon. The polyp was sessile. The       polyp was removed with a cold snare. Resection and retrieval were       complete. Estimated blood loss was minimal. Scattered and ambulation       seen from the rectosigmoid into the proximal sigmoid segment. No       neoplastic, infiltrating process or evidence of diverticulitis seen in       this segment of the colon. Impression:               - One 2 mm polyp in the cecum, removed with a cold  biopsy forceps. Resected and retrieved.                           - One 4 mm polyp in the sigmoid colon, removed with                            a cold snare. Resected and retrieved. Inflammatory                            changes seen at CT previously most likely                            representative of uncomplicated diverticulitis                            which has resolved. Moderate Sedation:      Moderate (conscious) sedation was personally administered by an        anesthesia professional. The following parameters were monitored: oxygen       saturation, heart rate, blood pressure, respiratory rate, EKG, adequacy       of pulmonary ventilation, and response to care. Recommendation:           - Patient has a contact number available for                            emergencies. The signs and symptoms of potential                            delayed complications were discussed with the                            patient. Return to normal activities tomorrow.                            Written discharge instructions were provided to the                            patient.                           - Advance diet as tolerated. Benefiber 2                            tablespoons daily. Follow-up on pathology. Timing                            of subsequent colonoscopy to be determined. See EGD                            report. Procedure Code(s):        --- Professional ---                           [phone], Colonoscopy, flexible; with removal of  tumor(s), polyp(s), or other lesion(s) by snare                            technique                           45380, 59, Colonoscopy, flexible; with biopsy,                            single or multiple Diagnosis Code(s):        --- Professional ---                           K63.5, Polyp of colon                           R93.3, Abnormal findings on diagnostic imaging of                            other parts of digestive tract CPT copyright 2019 [employer]. All rights reserved. The codes documented in this report are preliminary and upon coder review may  be revised to meet current compliance requirements. [name]. Lilyannah Zuelke, MD Norvel Richards, MD 03/05/2021 10:46:28 AM This report has been signed electronically. Number of Addenda: 0

## 2021-03-05 NOTE — Anesthesia Postprocedure Evaluation (Signed)
Anesthesia Post Note  Patient: Andrew Vargas  Procedure(s) Performed: COLONOSCOPY WITH PROPOFOL ESOPHAGOGASTRODUODENOSCOPY (EGD) WITH PROPOFOL BIOPSY POLYPECTOMY  Patient location during evaluation: Endoscopy Anesthesia Type: General Level of consciousness: awake and alert and oriented Pain management: pain level controlled Vital Signs Assessment: post-procedure vital signs reviewed and stable Respiratory status: spontaneous breathing, nonlabored ventilation and respiratory function stable Cardiovascular status: stable and blood pressure returned to baseline Postop Assessment: no apparent nausea or vomiting Anesthetic complications: no   No notable events documented.   Last Vitals:  Vitals:   03/05/21 0905 03/05/21 1035  BP: (!) 148/90 101/67  Pulse:  88  Resp:  19  Temp:  (!) 36.4 C  SpO2:  93%    Last Pain:  Vitals:   03/05/21 1038  TempSrc:   PainSc: 0-No pain                 Zillah Alexie C Yanilen Adamik

## 2021-03-05 NOTE — Transfer of Care (Signed)
Immediate Anesthesia Transfer of Care Note  Patient: Andrew Vargas  Procedure(s) Performed: COLONOSCOPY WITH PROPOFOL ESOPHAGOGASTRODUODENOSCOPY (EGD) WITH PROPOFOL BIOPSY POLYPECTOMY  Patient Location: Endoscopy Unit  Anesthesia Type:General  Level of Consciousness: drowsy  Airway & Oxygen Therapy: Patient Spontanous Breathing  Post-op Assessment: Report given to RN and Post -op Vital signs reviewed and stable  Post vital signs: Reviewed and stable  Last Vitals:  Vitals Value Taken Time  BP    Temp    Pulse    Resp    SpO2      Last Pain:  Vitals:   03/05/21 1001  TempSrc:   PainSc: 0-No pain      Patients Stated Pain Goal: 8 (90/21/11 5520)  Complications: No notable events documented.

## 2021-03-05 NOTE — Discharge Instructions (Signed)
EGD Discharge instructions Please read the instructions outlined below and refer to this sheet in the next few weeks. These discharge instructions provide you with general information on caring for yourself after you leave the hospital. Your doctor may also give you specific instructions. While your treatment has been planned according to the most current medical practices available, unavoidable complications occasionally occur. If you have any problems or questions after discharge, please call your doctor. ACTIVITY You may resume your regular activity but move at a slower pace for the next 24 hours.  Take frequent rest periods for the next 24 hours.  Walking will help expel (get rid of) the air and reduce the bloated feeling in your abdomen.  No driving for 24 hours (because of the anesthesia (medicine) used during the test).  You may shower.  Do not sign any important legal documents or operate any machinery for 24 hours (because of the anesthesia used during the test).  NUTRITION Drink plenty of fluids.  You may resume your normal diet.  Begin with a light meal and progress to your normal diet.  Avoid alcoholic beverages for 24 hours or as instructed by your caregiver.  MEDICATIONS You may resume your normal medications unless your caregiver tells you otherwise.  WHAT YOU CAN EXPECT TODAY You may experience abdominal discomfort such as a feeling of fullness or "gas" pains.  FOLLOW-UP Your doctor will discuss the results of your test with you.  SEEK IMMEDIATE MEDICAL ATTENTION IF ANY OF THE FOLLOWING OCCUR: Excessive nausea (feeling sick to your stomach) and/or vomiting.  Severe abdominal pain and distention (swelling).  Trouble swallowing.  Temperature over 101 F (37.8 C).  Rectal bleeding or vomiting of blood.    Colonoscopy Discharge Instructions  Read the instructions outlined below and refer to this sheet in the next few weeks. These discharge instructions provide you with  general information on caring for yourself after you leave the hospital. Your doctor may also give you specific instructions. While your treatment has been planned according to the most current medical practices available, unavoidable complications occasionally occur. If you have any problems or questions after discharge, call Dr. Gala Romney at [phone]. ACTIVITY You may resume your regular activity, but move at a slower pace for the next 24 hours.  Take frequent rest periods for the next 24 hours.  Walking will help get rid of the air and reduce the bloated feeling in your belly (abdomen).  No driving for 24 hours (because of the medicine (anesthesia) used during the test).   Do not sign any important legal documents or operate any machinery for 24 hours (because of the anesthesia used during the test).  NUTRITION Drink plenty of fluids.  You may resume your normal diet as instructed by your doctor.  Begin with a light meal and progress to your normal diet. Heavy or fried foods are harder to digest and may make you feel sick to your stomach (nauseated).  Avoid alcoholic beverages for 24 hours or as instructed.  MEDICATIONS You may resume your normal medications unless your doctor tells you otherwise.  WHAT YOU CAN EXPECT TODAY Some feelings of bloating in the abdomen.  Passage of more gas than usual.  Spotting of blood in your stool or on the toilet paper.  IF YOU HAD POLYPS REMOVED DURING THE COLONOSCOPY: No aspirin products for 7 days or as instructed.  No alcohol for 7 days or as instructed.  Eat a soft diet for the next 24 hours.  FINDING  OUT THE RESULTS OF YOUR TEST Not all test results are available during your visit. If your test results are not back during the visit, make an appointment with your caregiver to find out the results. Do not assume everything is normal if you have not heard from your caregiver or the medical facility. It is important for you to follow up on all of your test  results.  SEEK IMMEDIATE MEDICAL ATTENTION IF: You have more than a spotting of blood in your stool.  Your belly is swollen (abdominal distention).  You are nauseated or vomiting.  You have a temperature over 101.  You have abdominal pain or discomfort that is severe or gets worse throughout the day.     There was no damage in your esophagus today.  You need to be treated daily for GERD with Protonix  Continue Protonix 40 mg every day  2 small polyps removed in your colon.  You likely had diverticulitis which has resolved.  Begin Benefiber 1 tablespoon daily for 3 weeks; then increase to 2 tablespoons daily thereafter  Further recommendations to follow pending review of path report  At patient request, I called [facility] at [phone] -reviewed findings and recommendations

## 2021-03-05 NOTE — H&P (Signed)
@LOGO @   Primary Care Physician:  Coral Spikes, DO Primary Gastroenterologist:  Dr. Gala Romney  Pre-Procedure History & Physical: HPI:  Andrew Vargas is a 60 y.o. male here for further evaluation of abnormal colon on CT.  Recent bout of diverticulitis with thickening of left colon noted.  Also longstanding GERD; no prior assessment of upper GI tract.  Denies dysphagia.  Left-sided abdominal pain has resolved.  Reflux is pretty much been squelch and on Protonix 40 mg daily.  He has not had to take any Tums at all.  Past Medical History:  Diagnosis Date   Dyspnea    ED (erectile dysfunction)    GERD (gastroesophageal reflux disease)    Hyperlipidemia    SCC (squamous cell carcinoma) 05/26/2019   FRONT MID SCALP CX3 5FU   Shoulder pain    Squamous cell carcinoma of skin 06/10/2018   LEFT TIP OF THE NOSE MOHS   Squamous cell carcinoma of skin 12/20/2019   in situ-mid upper chest   Squamous cell carcinoma of skin 12/20/2019   in situ-right tip of nose   Tobacco abuse     Past Surgical History:  Procedure Laterality Date   COLONOSCOPY  05/02/2006   Normal rectum/Normal colon   COLONOSCOPY  10/22/99   tubular adenoma   COLONOSCOPY  11/21/2011   Dr. Gala Romney: 1 by 1.25 cm hyperplastic carpet polyp on distal side of IC valve   COLONOSCOPY WITH PROPOFOL N/A 09/21/2018   Procedure: COLONOSCOPY WITH PROPOFOL;  Surgeon: Daneil Dolin, MD;  Location: [facility];  Service: Endoscopy;  Laterality: N/A;  1:00pm   embryonal cyst     removed from throat   POLYPECTOMY  09/21/2018   Procedure: POLYPECTOMY;  Surgeon: Daneil Dolin, MD;  Location: [facility];  Service: Endoscopy;;  colon   UMBILICAL HERNIA REPAIR     as a baby    Prior to Admission medications   Medication Sig Start Date End Date Taking? Authorizing Provider  pantoprazole (PROTONIX) 40 MG tablet Take 1 tablet (40 mg total) by mouth daily. 01/30/21 03/01/21 Yes Donyale Berthold, [name], MD  polyethylene glycol-electrolytes (TRILYTE)  420 g solution Take 4,000 mLs by mouth as directed. 01/30/21  Yes Ryleeann Urquiza, [name], MD  Wheat Dextrin (BENEFIBER DRINK MIX PO) Take 5 mLs by mouth daily.   Yes [provider]    Allergies as of 01/30/2021   (No Known Allergies)    Family History  Problem Relation Age of Onset   Colon cancer Neg Hx     Social History   Socioeconomic History   Marital status: Married    Spouse name: Not on file   Number of children: Not on file   Years of education: Not on file   Highest education level: Not on file  Occupational History   Occupation: full time    Employer: [employer]  Tobacco Use   Smoking status: Every Day    Packs/day: 1.00    Types: Cigarettes   Smokeless tobacco: Never  Vaping Use   Vaping Use: Never used  Substance and Sexual Activity   Alcohol use: Yes    Alcohol/week: 0.0 standard drinks    Comment: socially   Drug use: No   Sexual activity: Not on file  Other Topics Concern   Not on file  Social History Narrative   Not on file   Social Determinants of Health   Financial Resource Strain: Not on file  Food Insecurity: Not on file  Transportation Needs: Not on file  Physical Activity: Not on file  Stress: Not on file  Social Connections: Not on file  Intimate Partner Violence: Not on file    Review of Systems: See HPI, otherwise negative ROS  Physical Exam: BP (!) 148/90   Pulse 89   Temp 97.9 F (36.6 C) (Oral)   Resp 18   Ht 6' (1.829 m)   Wt 93 kg   SpO2 96%   BMI 27.80 kg/m  General:   Alert,  Well-developed, well-nourished, pleasant and cooperative in NAD ns. Neck:  Supple; no masses or thyromegaly. No significant cervical adenopathy. Lungs:  Clear throughout to auscultation.   No wheezes, crackles, or rhonchi. No acute distress. Heart:  Regular rate and rhythm; no murmurs, clicks, rubs,  or gallops. Abdomen: Non-distended, normal bowel sounds.  Soft and nontender without appreciable mass or hepatosplenomegaly.   Pulses:  Normal pulses noted. Extremities:  Without clubbing or edema.  Impression/Plan: 60 year old gentleman with abnormal left colon most consistent with diverticulitis-clinically resolved.  Further evaluation of his colon indicated now.  Longstanding GERD.  Now well controlled.  No prior upper GI tract evaluation.  Denies dysphagia.  Recommendations:  I have offered the patient both an EGD and colonoscopy per plan.  Risk, benefits, limitations alternatives and imponderables have been reviewed. Patient's been answered and he is agreeable.  Further recommendations to follow.       Notice: This dictation was prepared with Dragon dictation along with smaller phrase technology. Any transcriptional errors that result from this process are unintentional and may not be corrected upon review.

## 2021-03-07 ENCOUNTER — Encounter (HOSPITAL_COMMUNITY): Payer: Self-pay | Admitting: Internal Medicine

## 2021-03-07 LAB — SURGICAL PATHOLOGY

## 2021-03-11 ENCOUNTER — Encounter: Payer: Self-pay | Admitting: Internal Medicine

## 2021-03-12 ENCOUNTER — Other Ambulatory Visit: Payer: Self-pay

## 2021-03-12 ENCOUNTER — Encounter: Payer: Self-pay | Admitting: Dermatology

## 2021-03-12 ENCOUNTER — Ambulatory Visit (INDEPENDENT_AMBULATORY_CARE_PROVIDER_SITE_OTHER): Payer: Commercial Managed Care - PPO | Admitting: Dermatology

## 2021-03-12 DIAGNOSIS — D485 Neoplasm of uncertain behavior of skin: Secondary | ICD-10-CM

## 2021-03-12 DIAGNOSIS — Z1283 Encounter for screening for malignant neoplasm of skin: Secondary | ICD-10-CM | POA: Diagnosis not present

## 2021-03-12 DIAGNOSIS — R238 Other skin changes: Secondary | ICD-10-CM

## 2021-03-12 DIAGNOSIS — L821 Other seborrheic keratosis: Secondary | ICD-10-CM | POA: Diagnosis not present

## 2021-03-12 DIAGNOSIS — D1801 Hemangioma of skin and subcutaneous tissue: Secondary | ICD-10-CM | POA: Diagnosis not present

## 2021-03-12 DIAGNOSIS — Z85828 Personal history of other malignant neoplasm of skin: Secondary | ICD-10-CM

## 2021-03-12 DIAGNOSIS — L820 Inflamed seborrheic keratosis: Secondary | ICD-10-CM

## 2021-03-12 DIAGNOSIS — L570 Actinic keratosis: Secondary | ICD-10-CM

## 2021-03-12 MED ORDER — TOLAK 4 % EX CREA: 1.0000 "application " | TOPICAL_CREAM | Freq: Every evening | CUTANEOUS | 1 refills | Status: DC

## 2021-03-12 NOTE — Patient Instructions (Signed)

## 2021-04-04 ENCOUNTER — Encounter: Payer: Self-pay | Admitting: Dermatology

## 2021-04-04 NOTE — Progress Notes (Signed)
° °  Follow-Up Visit   Subjective  Andrew Vargas is a 61 y.o. male who presents for the following: Annual Exam (Pt here for yrly skin exam. Pt has no family hx of skin cancer or atypical moles. Pt has personal hx of skin cancer. ).  Annual skin examination, growth on nose and crusts on scalp. Location:  Duration:  Quality:  Associated Signs/Symptoms: Modifying Factors:  Severity:  Timing: Context:   Objective  Well appearing patient in no apparent distress; mood and affect are within normal limits. Upper Body Waist up skin examination: No atypical pigmented lesions.  1 possible nonmelanoma skin cancer on nose will be biopsied.  Several 3 to 8 mm flattopped brown textured papules   Multiple 1 mm smooth red dermal papules   Right Ala Nasi Subtle pearly 4 mm endophytic papule.       Left Forehead, Mid Forehead, Right Forehead, Scalp Half dozen mostly small gritty pink crusts    All skin waist up examined.   Assessment & Plan    Screening exam for skin cancer Upper Body  Annual skin examination.  Encouraged to self examine with spouse twice annually.  Continue ultraviolet protection.  Encouraged New Year's resolution to quit smoking.  Neoplasm of uncertain behavior of skin Right Ala Nasi  Skin / nail biopsy Type of biopsy: tangential   Informed consent: discussed and consent obtained   Timeout: patient name, date of birth, surgical site, and procedure verified   Anesthesia: the lesion was anesthetized in a standard fashion   Anesthetic:  1% lidocaine w/ epinephrine 1-100,000 local infiltration Instrument used: flexible razor blade   Hemostasis achieved with: ferric subsulfate   Outcome: patient tolerated procedure well   Post-procedure details: wound care instructions given    Specimen 1 - Surgical pathology Differential Diagnosis: R/O BCC vs SCC  Check Margins: No  Seborrheic keratosis  Benign, ok to leave unless pt wants removed or clinically  change  Venous lake Left Posterior Neck  Benign, ok to leave  Cherry angioma  No intervention necessary  Actinic keratosis (4) Mid Forehead; Left Forehead; Right Forehead; Scalp  Will do topical fluorouracil after the new year.  Goal of 28 total applications but to contact me if there is too much irritation.  Related Medications Fluorouracil (TOLAK) 4 % CREA Apply 1 application topically at bedtime.      I, Lavonna Monarch, MD, have reviewed all documentation for this visit.  The documentation on 04/04/21 for the exam, diagnosis, procedures, and orders are all accurate and complete.

## 2021-07-07 ENCOUNTER — Ambulatory Visit
Admission: EM | Admit: 2021-07-07 | Discharge: 2021-07-07 | Disposition: A | Discharge status: Home / Self Care | Payer: BC Managed Care – PPO | Attending: Urgent Care | Admitting: Urgent Care

## 2021-07-07 DIAGNOSIS — M79642 Pain in left hand: Secondary | ICD-10-CM

## 2021-07-07 DIAGNOSIS — T23212A Burn of second degree of left thumb (nail), initial encounter: Secondary | ICD-10-CM

## 2021-07-07 MED ORDER — SILVER SULFADIAZINE 1 % EX CREA: 1.0000 "application " | TOPICAL_CREAM | Freq: Every day | CUTANEOUS | 0 refills | Status: DC

## 2021-07-07 NOTE — Discharge Instructions (Signed)
Please change your dressing 2-3 times daily. Apply silvadene cream generously and secure with a non-stick dressing. Each time you change your dressing, make sure you clean gently around the perimeter of the wound with gentle soap and warm water. Do not peel off any dead skin. If it comes off in the process of washing your wound or removing the dressing, that's okay. Pat your wound dry and let it air out if possible for an hour before reapplying another dressing.  ?

## 2021-07-07 NOTE — ED Provider Notes (Signed)
?[place] ? ? ?MRN: [id number] DOB: 1960/05/10 ? ?Subjective:  ? ?Andrew Vargas is a 61 y.o. male presenting for suffering a burn to the left thumb this morning.  Patient was handling liquid propane and it accidentally spilled over the left thumb and hand.  This caused a burn to the area and has since had some pain and tingling of the thumb and radial aspect of the palm.  Tdap is up-to-date as of 2020. ? ?No current facility-administered medications for this encounter. ? ?Current Outpatient Medications:  ?  Fluorouracil (TOLAK) 4 % CREA, Apply 1 application topically at bedtime., Disp: 40 g, Rfl: 1 ?  pantoprazole (PROTONIX) 40 MG tablet, Take 1 tablet (40 mg total) by mouth daily., Disp: 30 tablet, Rfl: 11 ?  polyethylene glycol-electrolytes (TRILYTE) 420 g solution, Take 4,000 mLs by mouth as directed. (Patient not taking: Reported on 03/12/2021), Disp: 4000 mL, Rfl: 0 ?  Wheat Dextrin (BENEFIBER DRINK MIX PO), Take 5 mLs by mouth daily. (Patient not taking: Reported on 03/12/2021), Disp: , Rfl:   ? ?No Known Allergies ? ?Past Medical History:  ?Diagnosis Date  ? Dyspnea   ? ED (erectile dysfunction)   ? GERD (gastroesophageal reflux disease)   ? Hyperlipidemia   ? SCC (squamous cell carcinoma) 05/26/2019  ? FRONT MID SCALP CX3 5FU  ? Shoulder pain   ? Squamous cell carcinoma of skin 06/10/2018  ? LEFT TIP OF THE NOSE MOHS  ? Squamous cell carcinoma of skin 12/20/2019  ? in situ-mid upper chest  ? Squamous cell carcinoma of skin 12/20/2019  ? in situ-right tip of nose  ? Tobacco abuse   ?  ? ?Past Surgical History:  ?Procedure Laterality Date  ? BIOPSY  03/05/2021  ? Procedure: BIOPSY;  Surgeon: Daneil Dolin, MD;  [facility];  Service: Endoscopy;;  ? COLONOSCOPY  05/02/2006  ? Normal rectum/Normal colon  ? COLONOSCOPY  10/22/99  ? tubular adenoma  ? COLONOSCOPY  11/21/2011  ? Dr. Gala Romney: 1 by 1.25 cm hyperplastic carpet polyp on distal side of IC valve  ? COLONOSCOPY WITH PROPOFOL  N/A 09/21/2018  ? Procedure: COLONOSCOPY WITH PROPOFOL;  Surgeon: Daneil Dolin, MD;  [facility]: [facility];  Service: Endoscopy;  Laterality: N/A;  1:00pm  ? COLONOSCOPY WITH PROPOFOL N/A 03/05/2021  ? Procedure: COLONOSCOPY WITH PROPOFOL;  Surgeon: Daneil Dolin, MD;  Location: [facility];  Service: Endoscopy;  Laterality: N/A;  10:15am  ? embryonal cyst    ? removed from throat  ? ESOPHAGOGASTRODUODENOSCOPY (EGD) WITH PROPOFOL N/A 03/05/2021  ? Procedure: ESOPHAGOGASTRODUODENOSCOPY (EGD) WITH PROPOFOL;  Surgeon: Daneil Dolin, MD;  [facility]: [facility];  Service: Endoscopy;  Laterality: N/A;  ? POLYPECTOMY  09/21/2018  ? Procedure: POLYPECTOMY;  Surgeon: Daneil Dolin, MD;  [facility];  Service: Endoscopy;;  colon  ? POLYPECTOMY  03/05/2021  ? Procedure: POLYPECTOMY;  Surgeon: Daneil Dolin, MD;  [facility];  Service: Endoscopy;;  ? UMBILICAL HERNIA REPAIR    ? as a baby  ? ? ?Family History  ?Problem Relation Age of Onset  ? Colon cancer Neg Hx   ? ? ?Social History  ? ?Tobacco Use  ? Smoking status: Every Day  ?  Packs/day: 1.00  ?  Types: Cigarettes  ? Smokeless tobacco: Never  ?Vaping Use  ? Vaping Use: Never used  ?Substance Use Topics  ? Alcohol use: Yes  ?  Alcohol/week: 0.0 standard drinks  ?  Comment: socially  ? Drug use: No  ? ? ?ROS ? ? ?Objective:  ? ?Vitals: ?BP 132/88 (BP Location: Right Arm)   Pulse 81   Temp 97.8 ?F (36.6 ?C) (Oral)   Resp 18   SpO2 94%  ? ?Physical Exam ?Constitutional:   ?   General: He is not in acute distress. ?   Appearance: Normal appearance. He is well-developed and normal weight. He is not ill-appearing, toxic-appearing or diaphoretic.  ?HENT:  ?   Head: Normocephalic and atraumatic.  ?   Right Ear: External ear normal.  ?   Left Ear: External ear normal.  ?   Nose: Nose normal.  ?   Mouth/Throat:  ?   Pharynx: Oropharynx is clear.  ?Eyes:  ?   General: No scleral icterus.    ?   Right eye: No discharge.     ?   Left eye: No  discharge.  ?   Extraocular Movements: Extraocular movements intact.  ?Cardiovascular:  ?   Rate and Rhythm: Normal rate.  ?Pulmonary:  ?   Effort: Pulmonary effort is normal.  ?Musculoskeletal:     ?   General: Normal range of motion.  ?   Cervical back: Normal range of motion.  ?   Comments: Partial-thickness burn of the left thumb extending into the hand and palm.  Strength 5/5.  ?Neurological:  ?   Mental Status: He is alert and oriented to person, place, and time.  ?Psychiatric:     ?   Mood and Affect: Mood normal.     ?   Behavior: Behavior normal.     ?   Thought Content: Thought content normal.     ?   Judgment: Judgment normal.  ? ? ? ? ?Burn care: A generous amount of Silvadene cream was applied to the burn, secured with nonadherent and then Coban. ? ?Assessment and Plan :  ? ?PDMP not reviewed this encounter. ? ?1. Partial thickness burn thumb, left, initial encounter   ?2. Left hand pain   ? ?Tdap is up-to-date.  Burn care provided.  Will defer antibiotic use for now.  Tylenol and/or ibuprofen for pain relief. Counseled patient on potential for adverse effects with medications prescribed/recommended today, ER and return-to-clinic precautions discussed, patient verbalized understanding. ? ?  ?Jaynee Eagles, PA-C ?07/07/21 1401 ? ?

## 2021-07-07 NOTE — ED Triage Notes (Signed)
Pt reports pain and swelling in the left thumb since this morning. 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  States left thumb feels numb.  ?

## 2021-08-16 ENCOUNTER — Encounter: Payer: Self-pay | Admitting: Internal Medicine

## 2021-10-02 ENCOUNTER — Telehealth: Payer: Self-pay | Admitting: Family Medicine

## 2021-10-02 DIAGNOSIS — Z125 Encounter for screening for malignant neoplasm of prostate: Secondary | ICD-10-CM

## 2021-10-02 DIAGNOSIS — R7303 Prediabetes: Secondary | ICD-10-CM

## 2021-10-02 DIAGNOSIS — Z0000 Encounter for general adult medical examination without abnormal findings: Secondary | ICD-10-CM

## 2021-10-02 DIAGNOSIS — Z1159 Encounter for screening for other viral diseases: Secondary | ICD-10-CM

## 2021-10-02 DIAGNOSIS — Z114 Encounter for screening for human immunodeficiency virus [HIV]: Secondary | ICD-10-CM

## 2021-10-02 NOTE — Telephone Encounter (Signed)
Patient has physical on 8/21 and needing labs done

## 2021-10-02 NOTE — Telephone Encounter (Signed)
Last labs 10/22: CMP and CBC

## 2021-10-03 NOTE — Telephone Encounter (Signed)
For wellness Recommend lipid, liver, metabolic 7, CBC, PSA Also per CDC screening guidelines recommend hepatitis C antibody as well as HIV antibody

## 2021-10-03 NOTE — Telephone Encounter (Signed)
Patient has been informed.

## 2021-10-31 DIAGNOSIS — Z0000 Encounter for general adult medical examination without abnormal findings: Secondary | ICD-10-CM | POA: Diagnosis not present

## 2021-10-31 DIAGNOSIS — Z125 Encounter for screening for malignant neoplasm of prostate: Secondary | ICD-10-CM | POA: Diagnosis not present

## 2021-10-31 DIAGNOSIS — Z1159 Encounter for screening for other viral diseases: Secondary | ICD-10-CM | POA: Diagnosis not present

## 2021-10-31 DIAGNOSIS — Z114 Encounter for screening for human immunodeficiency virus [HIV]: Secondary | ICD-10-CM | POA: Diagnosis not present

## 2021-11-01 LAB — CBC WITH DIFFERENTIAL/PLATELET
Basophils Absolute: 0.1 10*3/uL (ref 0.0–0.2)
Basos: 1 %
EOS (ABSOLUTE): 0.3 10*3/uL (ref 0.0–0.4)
Eos: 4 %
Hematocrit: 53.5 % — ABNORMAL HIGH (ref 37.5–51.0)
Hemoglobin: 17.7 g/dL (ref 13.0–17.7)
Immature Grans (Abs): 0 10*3/uL (ref 0.0–0.1)
Immature Granulocytes: 0 %
Lymphocytes Absolute: 2.1 10*3/uL (ref 0.7–3.1)
Lymphs: 27 %
MCH: 28.5 pg (ref 26.6–33.0)
MCHC: 33.1 g/dL (ref 31.5–35.7)
MCV: 86 fL (ref 79–97)
Monocytes Absolute: 0.9 10*3/uL (ref 0.1–0.9)
Monocytes: 11 %
Neutrophils Absolute: 4.6 10*3/uL (ref 1.4–7.0)
Neutrophils: 57 %
Platelets: 208 10*3/uL (ref 150–450)
RBC: 6.22 x10E6/uL — ABNORMAL HIGH (ref 4.14–5.80)
RDW: 13.6 % (ref 11.6–15.4)
WBC: 7.9 10*3/uL (ref 3.4–10.8)

## 2021-11-01 LAB — BASIC METABOLIC PANEL
BUN/Creatinine Ratio: 15 (ref 10–24)
BUN: 16 mg/dL (ref 8–27)
CO2: 24 mmol/L (ref 20–29)
Calcium: 9.6 mg/dL (ref 8.6–10.2)
Chloride: 100 mmol/L (ref 96–106)
Creatinine, Ser: 1.09 mg/dL (ref 0.76–1.27)
Glucose: 91 mg/dL (ref 70–99)
Potassium: 4.4 mmol/L (ref 3.5–5.2)
Sodium: 138 mmol/L (ref 134–144)
eGFR: 78 mL/min/{1.73_m2} (ref >59)

## 2021-11-01 LAB — HEPATITIS C ANTIBODY: Hep C Virus Ab: NONREACTIVE

## 2021-11-01 LAB — PSA: Prostate Specific Ag, Serum: 1.2 ng/mL (ref 0.0–4.0)

## 2021-11-01 LAB — LIPID PANEL
Chol/HDL Ratio: 4.7 ratio (ref 0.0–5.0)
Cholesterol, Total: 217 mg/dL — ABNORMAL HIGH (ref 100–199)
HDL: 46 mg/dL (ref >39)
LDL Chol Calc (NIH): 150 mg/dL — ABNORMAL HIGH (ref 0–99)
Triglycerides: 114 mg/dL (ref 0–149)
VLDL Cholesterol Cal: 21 mg/dL (ref 5–40)

## 2021-11-01 LAB — HEPATIC FUNCTION PANEL
ALT: 23 IU/L (ref 0–44)
AST: 17 IU/L (ref 0–40)
Albumin: 4.5 g/dL (ref 3.8–4.9)
Alkaline Phosphatase: 80 IU/L (ref 44–121)
Bilirubin Total: 0.4 mg/dL (ref 0.0–1.2)
Bilirubin, Direct: 0.12 mg/dL (ref 0.00–0.40)
Total Protein: 7.3 g/dL (ref 6.0–8.5)

## 2021-11-01 LAB — HIV ANTIBODY (ROUTINE TESTING W REFLEX): HIV Screen 4th Generation wRfx: NONREACTIVE

## 2021-11-12 ENCOUNTER — Ambulatory Visit (INDEPENDENT_AMBULATORY_CARE_PROVIDER_SITE_OTHER): Payer: BC Managed Care – PPO | Admitting: Family Medicine

## 2021-11-12 ENCOUNTER — Encounter: Payer: Self-pay | Admitting: Family Medicine

## 2021-11-12 VITALS — BP 133/89 | HR 77 | Temp 97.5°F | Ht 72.0 in | Wt 209.6 lb

## 2021-11-12 DIAGNOSIS — Z0000 Encounter for general adult medical examination without abnormal findings: Secondary | ICD-10-CM | POA: Diagnosis not present

## 2021-11-12 DIAGNOSIS — N401 Enlarged prostate with lower urinary tract symptoms: Secondary | ICD-10-CM | POA: Diagnosis not present

## 2021-11-12 DIAGNOSIS — I700 Atherosclerosis of aorta: Secondary | ICD-10-CM | POA: Diagnosis not present

## 2021-11-12 DIAGNOSIS — Z720 Tobacco use: Secondary | ICD-10-CM | POA: Diagnosis not present

## 2021-11-12 DIAGNOSIS — G4700 Insomnia, unspecified: Secondary | ICD-10-CM | POA: Insufficient documentation

## 2021-11-12 DIAGNOSIS — N400 Enlarged prostate without lower urinary tract symptoms: Secondary | ICD-10-CM | POA: Insufficient documentation

## 2021-11-12 DIAGNOSIS — F5101 Primary insomnia: Secondary | ICD-10-CM

## 2021-11-12 DIAGNOSIS — E785 Hyperlipidemia, unspecified: Secondary | ICD-10-CM | POA: Insufficient documentation

## 2021-11-12 DIAGNOSIS — N5201 Erectile dysfunction due to arterial insufficiency: Secondary | ICD-10-CM | POA: Diagnosis not present

## 2021-11-12 DIAGNOSIS — R351 Nocturia: Secondary | ICD-10-CM

## 2021-11-12 MED ORDER — TAMSULOSIN HCL 0.4 MG PO CAPS: 0.4000 mg | ORAL_CAPSULE | Freq: Every day | ORAL | 1 refills | Status: DC

## 2021-11-12 MED ORDER — ZOLPIDEM TARTRATE 5 MG PO TABS: 5.0000 mg | ORAL_TABLET | Freq: Every evening | ORAL | 3 refills | Status: DC | PRN

## 2021-11-12 MED ORDER — SILDENAFIL CITRATE 50 MG PO TABS: 50.0000 mg | ORAL_TABLET | Freq: Every day | ORAL | 6 refills | Status: DC | PRN

## 2021-11-12 NOTE — Patient Instructions (Signed)
Medications as prescribed.  Consider CT Lung cancer screening.  Work on smoking cessation.  Follow up in 6 months.

## 2021-11-12 NOTE — Assessment & Plan Note (Signed)
Trial of Ambien.

## 2021-11-12 NOTE — Progress Notes (Signed)
Subjective:  Patient ID: Andrew Vargas, male    DOB: [birth year]  Age: 61 y.o. MRN: [id number]  CC: Chief Complaint  Patient presents with   Annual Exam    Waking up frequently to urinate; weak flow; has been worsening. Pt has hard time falling asleep at night; once asleep pt "sleeps like a rock"-going on for a couple of years. Pt was recently switched from 1st to 2nd shift.     HPI:  61 year old male with tobacco abuse, hyperlipidemia, GERD presents for an annual physical exam.  Issues and concerns are below.  Preventative Healthcare Colonoscopy: Up-to-date. Immunizations Tetanus -up-to-date. Pneumococcal -candidate for pneumococcal vaccine given tobacco abuse. Shingles -candidate for given age. Prostate cancer screening: Up-to-date. Hepatitis C screening -done. Labs: Had recent labs.  Will review today. Smoking/tobacco use: Smokes 1 pack/day.  We will discuss CT lung cancer screening. STD/HIV testing: Has had HIV screening.  Insomnia Ongoing issues with insomnia.  Difficulty falling asleep. He states that he takes 3 to 4 hours to fall asleep.  No issues maintaining sleep. He has tried over-the-counter medication as well as his wife's trazodone without significant improvement.  Erectile dysfunction Has a history of erectile dysfunction.  States that he is having difficulty with erections.  He has a good drive/libido.  He would like to discuss medication today.  Nocturia Patient reports that he is having difficulty with nocturia.  Gets up 2 times at night. He also states that he has some urgency and feelings that he has not completely emptied during the day.  Patient Active Problem List   Diagnosis Date Noted   Aortic atherosclerosis ([place]) 11/12/2021   Hyperlipidemia 11/12/2021   Tobacco abuse 11/12/2021   BPH (benign prostatic hyperplasia) 11/12/2021   Annual physical exam 11/12/2021   Insomnia 11/12/2021   History of colonic polyps 06/05/2018   Erectile dysfunction  04/27/2013   GERD (gastroesophageal reflux disease) 11/07/2011    Social Hx   Social History   Socioeconomic History   Marital status: Married    Spouse name: Not on file   Number of children: Not on file   Years of education: Not on file   Highest education level: Not on file  Occupational History   Occupation: full time    Employer: [employer]  Tobacco Use   Smoking status: Every Day    Packs/day: 1.00    Types: Cigarettes   Smokeless tobacco: Never  Vaping Use   Vaping Use: Never used  Substance and Sexual Activity   Alcohol use: Yes    Alcohol/week: 0.0 standard drinks of alcohol    Comment: socially   Drug use: No   Sexual activity: Not on file  Other Topics Concern   Not on file  Social History Narrative   Not on file   Social Determinants of Health   Financial Resource Strain: Not on file  Food Insecurity: Not on file  Transportation Needs: Not on file  Physical Activity: Not on file  Stress: Not on file  Social Connections: Not on file    Review of Systems Per HPI  Objective:  BP 133/89   Pulse 77   Temp (!) 97.5 F (36.4 C)   Ht 6' (1.829 m)   Wt 209 lb 9.6 oz (95.1 kg)   SpO2 97%   BMI 28.43 kg/m      11/12/2021    8:55 AM 07/07/2021   12:53 PM 03/05/2021   10:35 AM  BP/Weight  Systolic BP 607 371 062  Diastolic BP 89 88 67  Wt. (Lbs) 209.6    BMI 28.43 kg/m2      Physical Exam Vitals and nursing note reviewed.  Constitutional:      General: He is not in acute distress.    Appearance: Normal appearance. He is not ill-appearing.  HENT:     Head: Normocephalic and atraumatic.  Eyes:     General:        Right eye: No discharge.        Left eye: No discharge.     Conjunctiva/sclera: Conjunctivae normal.  Cardiovascular:     Rate and Rhythm: Normal rate and regular rhythm.  Pulmonary:     Effort: Pulmonary effort is normal.     Breath sounds: Normal breath sounds. No wheezing, rhonchi or rales.  Abdominal:      General: There is no distension.     Palpations: Abdomen is soft.     Tenderness: There is no abdominal tenderness.  Neurological:     Mental Status: He is alert.  Psychiatric:        Mood and Affect: Mood normal.        Behavior: Behavior normal.     Lab Results  Component Value Date   WBC 7.9 10/31/2021   HGB 17.7 10/31/2021   HCT 53.5 (H) 10/31/2021   PLT 208 10/31/2021   GLUCOSE 91 10/31/2021   CHOL 217 (H) 10/31/2021   TRIG 114 10/31/2021   HDL 46 10/31/2021   LDLCALC 150 (H) 10/31/2021   ALT 23 10/31/2021   AST 17 10/31/2021   NA 138 10/31/2021   K 4.4 10/31/2021   CL 100 10/31/2021   CREATININE 1.09 10/31/2021   BUN 16 10/31/2021   CO2 24 10/31/2021   TSH 1.730 01/15/2016   PSA 1.08 05/03/2013     Assessment & Plan:   Problem List Items Addressed This Visit       Cardiovascular and Mediastinum   Aortic atherosclerosis ([place])   Relevant Medications   sildenafil (VIAGRA) 50 MG tablet     Genitourinary   BPH (benign prostatic hyperplasia)    Patient's symptoms consistent with BPH.  Treating with Flomax.      Relevant Medications   tamsulosin (FLOMAX) 0.4 MG CAPS capsule     Other   Annual physical exam - Primary    Patient recent laboratory studies reviewed with him today. Colonoscopy up-to-date.  Tetanus up-to-date.  He is a candidate for pneumococcal vaccine and shingles vaccine.  We will wait at this time as he is going to be losing his job/changing insurances. Discussed CT lung cancer screening.  This is deferred until next year due to the above. Advised smoking cessation.      Erectile dysfunction    Starting on sildenafil.      Insomnia    Trial of Ambien.      Tobacco abuse    Meds ordered this encounter  Medications   tamsulosin (FLOMAX) 0.4 MG CAPS capsule    Sig: Take 1 capsule (0.4 mg total) by mouth daily.    Dispense:  90 capsule    Refill:  1   sildenafil (VIAGRA) 50 MG tablet    Sig: Take 1-2 tablets (50-100 mg total) by  mouth daily as needed for erectile dysfunction.    Dispense:  20 tablet    Refill:  6   zolpidem (AMBIEN) 5 MG tablet    Sig: Take 1 tablet (5 mg total) by mouth at bedtime as needed for  sleep.    Dispense:  30 tablet    Refill:  3    Follow-up:  Return in about 6 months (around 05/15/2022).  Guy

## 2021-11-12 NOTE — Assessment & Plan Note (Signed)
Patient recent laboratory studies reviewed with him today. Colonoscopy up-to-date.  Tetanus up-to-date.  He is a candidate for pneumococcal vaccine and shingles vaccine.  We will wait at this time as he is going to be losing his job/changing insurances. Discussed CT lung cancer screening.  This is deferred until next year due to the above. Advised smoking cessation.

## 2021-11-12 NOTE — Assessment & Plan Note (Signed)
Patient's symptoms consistent with BPH.  Treating with Flomax.

## 2021-11-12 NOTE — Assessment & Plan Note (Signed)
Starting on sildenafil.

## 2021-11-15 ENCOUNTER — Other Ambulatory Visit: Payer: Self-pay | Admitting: *Deleted

## 2021-11-15 MED ORDER — SILDENAFIL CITRATE 50 MG PO TABS: 50.0000 mg | ORAL_TABLET | Freq: Every day | ORAL | 6 refills | Status: DC | PRN

## 2022-05-15 ENCOUNTER — Other Ambulatory Visit (HOSPITAL_COMMUNITY): Payer: Self-pay

## 2022-05-15 ENCOUNTER — Encounter: Payer: Self-pay | Admitting: Family Medicine

## 2022-05-15 ENCOUNTER — Ambulatory Visit (INDEPENDENT_AMBULATORY_CARE_PROVIDER_SITE_OTHER): Payer: 59 | Admitting: Family Medicine

## 2022-05-15 VITALS — BP 120/70 | HR 80 | Temp 98.6°F | Ht 72.0 in | Wt 220.0 lb

## 2022-05-15 DIAGNOSIS — Z0000 Encounter for general adult medical examination without abnormal findings: Secondary | ICD-10-CM

## 2022-05-15 DIAGNOSIS — K219 Gastro-esophageal reflux disease without esophagitis: Secondary | ICD-10-CM | POA: Diagnosis not present

## 2022-05-15 DIAGNOSIS — E7800 Pure hypercholesterolemia, unspecified: Secondary | ICD-10-CM

## 2022-05-15 DIAGNOSIS — Z130 Encounter for screening for diseases of the blood and blood-forming organs and certain disorders involving the immune mechanism: Secondary | ICD-10-CM

## 2022-05-15 DIAGNOSIS — N5201 Erectile dysfunction due to arterial insufficiency: Secondary | ICD-10-CM

## 2022-05-15 DIAGNOSIS — Z720 Tobacco use: Secondary | ICD-10-CM

## 2022-05-15 DIAGNOSIS — Z0001 Encounter for general adult medical examination with abnormal findings: Secondary | ICD-10-CM | POA: Diagnosis not present

## 2022-05-15 MED ORDER — PANTOPRAZOLE SODIUM 40 MG PO TBEC
40.0000 mg | DELAYED_RELEASE_TABLET | Freq: Every day | ORAL | 1 refills | Status: DC
  Filled 2022-05-15 – 2022-05-16 (×2): qty 90, 90d supply, fill #0
  Filled 2023-01-31 – 2023-02-03 (×3): qty 90, 90d supply, fill #1

## 2022-05-15 MED ORDER — BUPROPION HCL ER (SR) 150 MG PO TB12
150.0000 mg | ORAL_TABLET | Freq: Two times a day (BID) | ORAL | 3 refills | Status: DC
  Filled 2022-05-15 – 2022-05-16 (×2): qty 60, 30d supply, fill #0
  Filled 2022-09-16: qty 60, 30d supply, fill #1
  Filled 2023-01-31 – 2023-02-03 (×3): qty 60, 30d supply, fill #2

## 2022-05-15 NOTE — Assessment & Plan Note (Signed)
Referral for CT lung cancer screening.  Starting Wellbutrin for smoking cessation. Patient to get 2nd dose of Shingrix in the near future. Labs today.

## 2022-05-15 NOTE — Progress Notes (Signed)
Subjective:  Patient ID: Andrew Vargas, male    DOB: September 11, 1960  Age: 62 y.o. MRN: [id number]  CC: Chief Complaint  Patient presents with   6 month folllow up     No concerns    HPI:  62 year old male with tobacco abuse, GERD, aortic atherosclerosis, hyperlipidemia presents for an annual physical examination.  Patient continues to smoke. He has a longstanding history of smoking. Needs CT lung cancer screening.   Interested in smoking cessation. Has tried Chantix in the past and did not tolerate due to vivid dreams.   Has had first Shingrix vaccine. Will complete it in the near future. Remainder of preventative healthcare is up to date.   He is feeling well. No chest pain. No increasing SOB.  Patient Active Problem List   Diagnosis Date Noted   Aortic atherosclerosis ([place]) 11/12/2021   Hyperlipidemia 11/12/2021   Tobacco abuse 11/12/2021   BPH (benign prostatic hyperplasia) 11/12/2021   Annual physical exam 11/12/2021   Insomnia 11/12/2021   Erectile dysfunction 04/27/2013   GERD (gastroesophageal reflux disease) 11/07/2011    Social Hx   Social History   Socioeconomic History   Marital status: Married    Spouse name: Not on file   Number of children: Not on file   Years of education: Not on file   Highest education level: Not on file  Occupational History   Occupation: full time    Employer: [employer]  Tobacco Use   Smoking status: Every Day    Packs/day: 1.00    Types: Cigarettes   Smokeless tobacco: Never  Vaping Use   Vaping Use: Never used  Substance and Sexual Activity   Alcohol use: Yes    Alcohol/week: 0.0 standard drinks of alcohol    Comment: socially   Drug use: No   Sexual activity: Not on file  Other Topics Concern   Not on file  Social History Narrative   Not on file   Social Determinants of Health   Financial Resource Strain: Not on file  Food Insecurity: Not on file  Transportation Needs: Not on file  Physical Activity: Not  on file  Stress: Not on file  Social Connections: Not on file    Review of Systems Per HPI  Objective:  BP 120/70   Pulse 80   Temp 98.6 F (37 C)   Ht 6' (1.829 m)   Wt 220 lb (99.8 kg)   SpO2 97%   BMI 29.84 kg/m      05/15/2022    9:08 AM 11/12/2021    8:55 AM 07/07/2021   12:53 PM  BP/Weight  Systolic BP 123456 [id number] Q000111Q  Diastolic BP 70 89 88  Wt. (Lbs) 220 209.6   BMI 29.84 kg/m2 28.43 kg/m2     Physical Exam Vitals and nursing note reviewed.  Constitutional:      General: He is not in acute distress.    Appearance: Normal appearance.  HENT:     Head: Normocephalic and atraumatic.  Eyes:     General:        Right eye: No discharge.        Left eye: No discharge.     Conjunctiva/sclera: Conjunctivae normal.  Cardiovascular:     Rate and Rhythm: Normal rate and regular rhythm.  Pulmonary:     Effort: Pulmonary effort is normal.     Breath sounds: Normal breath sounds. No wheezing, rhonchi or rales.  Neurological:     Mental Status:  He is alert.  Psychiatric:        Mood and Affect: Mood normal.        Behavior: Behavior normal.    Lab Results  Component Value Date   WBC 7.9 10/31/2021   HGB 17.7 10/31/2021   HCT 53.5 (H) 10/31/2021   PLT 208 10/31/2021   GLUCOSE 91 10/31/2021   CHOL 217 (H) 10/31/2021   TRIG 114 10/31/2021   HDL 46 10/31/2021   LDLCALC 150 (H) 10/31/2021   ALT 23 10/31/2021   AST 17 10/31/2021   NA 138 10/31/2021   K 4.4 10/31/2021   CL 100 10/31/2021   CREATININE 1.09 10/31/2021   BUN 16 10/31/2021   CO2 24 10/31/2021   TSH 1.730 01/15/2016   PSA 1.08 05/03/2013     Assessment & Plan:   Problem List Items Addressed This Visit       Digestive   GERD (gastroesophageal reflux disease)   Relevant Medications   pantoprazole (PROTONIX) 40 MG tablet   Other Relevant Orders   CMP14+EGFR     Other   Tobacco abuse   Relevant Orders   Ambulatory Referral Lung Cancer Screening [facility]   CMP14+EGFR    Hyperlipidemia   Relevant Orders   Lipid panel   Erectile dysfunction   Relevant Orders   CMP14+EGFR   Annual physical exam - Primary    Referral for CT lung cancer screening.  Starting Wellbutrin for smoking cessation. Patient to get 2nd dose of Shingrix in the near future. Labs today.       Other Visit Diagnoses     Screening for deficiency anemia       Relevant Orders   CBC       Meds ordered this encounter  Medications   pantoprazole (PROTONIX) 40 MG tablet    Sig: Take 1 tablet (40 mg total) by mouth daily.    Dispense:  90 tablet    Refill:  1   buPROPion (WELLBUTRIN SR) 150 MG 12 hr tablet    Sig: Take 1 tablet (150 mg total) by mouth 2 (two) times daily.    Dispense:  60 tablet    Refill:  3    Follow-up:  Return in about 6 months (around 11/13/2022).  [place]

## 2022-05-15 NOTE — Patient Instructions (Signed)
Referral placed for lung cancer screening.  Labs today.  Wellbutrin as prescribed.  Follow up in 6 months.

## 2022-05-16 ENCOUNTER — Other Ambulatory Visit: Payer: Self-pay | Admitting: *Deleted

## 2022-05-16 ENCOUNTER — Other Ambulatory Visit (HOSPITAL_COMMUNITY): Payer: Self-pay

## 2022-05-16 ENCOUNTER — Other Ambulatory Visit: Payer: Self-pay

## 2022-05-16 DIAGNOSIS — Z122 Encounter for screening for malignant neoplasm of respiratory organs: Secondary | ICD-10-CM

## 2022-05-16 DIAGNOSIS — Z87891 Personal history of nicotine dependence: Secondary | ICD-10-CM

## 2022-05-16 DIAGNOSIS — F17210 Nicotine dependence, cigarettes, uncomplicated: Secondary | ICD-10-CM

## 2022-05-16 LAB — CMP14+EGFR
ALT: 32 IU/L (ref 0–44)
AST: 22 IU/L (ref 0–40)
Albumin/Globulin Ratio: 1.6 (ref 1.2–2.2)
Albumin: 4.5 g/dL (ref 3.8–4.9)
Alkaline Phosphatase: 82 IU/L (ref 44–121)
BUN/Creatinine Ratio: 14 (ref 10–24)
BUN: 12 mg/dL (ref 8–27)
Bilirubin Total: 0.4 mg/dL (ref 0.0–1.2)
CO2: 24 mmol/L (ref 20–29)
Calcium: 9.3 mg/dL (ref 8.6–10.2)
Chloride: 100 mmol/L (ref 96–106)
Creatinine, Ser: 0.86 mg/dL (ref 0.76–1.27)
Globulin, Total: 2.8 g/dL (ref 1.5–4.5)
Glucose: 85 mg/dL (ref 70–99)
Potassium: 4.6 mmol/L (ref 3.5–5.2)
Sodium: 136 mmol/L (ref 134–144)
Total Protein: 7.3 g/dL (ref 6.0–8.5)
eGFR: 99 mL/min/{1.73_m2} (ref >59)

## 2022-05-16 LAB — LIPID PANEL
Chol/HDL Ratio: 4.9 ratio (ref 0.0–5.0)
Cholesterol, Total: 224 mg/dL — ABNORMAL HIGH (ref 100–199)
HDL: 46 mg/dL (ref >39)
LDL Chol Calc (NIH): 158 mg/dL — ABNORMAL HIGH (ref 0–99)
Triglycerides: 112 mg/dL (ref 0–149)
VLDL Cholesterol Cal: 20 mg/dL (ref 5–40)

## 2022-05-16 LAB — CBC
Hematocrit: 53.4 % — ABNORMAL HIGH (ref 37.5–51.0)
Hemoglobin: 18.1 g/dL — ABNORMAL HIGH (ref 13.0–17.7)
MCH: 28.8 pg (ref 26.6–33.0)
MCHC: 33.9 g/dL (ref 31.5–35.7)
MCV: 85 fL (ref 79–97)
Platelets: 214 10*3/uL (ref 150–450)
RBC: 6.29 x10E6/uL — ABNORMAL HIGH (ref 4.14–5.80)
RDW: 13.8 % (ref 11.6–15.4)
WBC: 9.2 10*3/uL (ref 3.4–10.8)

## 2022-05-27 ENCOUNTER — Ambulatory Visit (INDEPENDENT_AMBULATORY_CARE_PROVIDER_SITE_OTHER): Payer: 59 | Admitting: Physician Assistant

## 2022-05-27 ENCOUNTER — Encounter: Payer: Self-pay | Admitting: Physician Assistant

## 2022-05-27 DIAGNOSIS — F17210 Nicotine dependence, cigarettes, uncomplicated: Secondary | ICD-10-CM | POA: Diagnosis not present

## 2022-05-27 NOTE — Progress Notes (Signed)
Virtual Visit via Telephone Note  I connected with Andrew Vargas on 05/27/22 at 10:30 AM EST by telephone and verified that I am speaking with the correct person using two identifiers.  Location: Patient: home Provider: working virtually from home   I discussed the limitations, risks, security and privacy concerns of performing an evaluation and management service by telephone and the availability of in person appointments. I also discussed with the patient that there may be a patient responsible charge related to this service. The patient expressed understanding and agreed to proceed.     Shared Decision Making Visit Lung Cancer Screening Program [phone])   Eligibility: Age 62 y.o. Pack Years Smoking History Calculation 40 (# packs/per year x # years smoked) Recent History of coughing up blood  no Unexplained weight loss? no ( >Than 15 pounds within the last 6 months ) Prior History Lung / other cancer no (Diagnosis within the last 5 years already requiring surveillance chest CT Scans). Smoking Status Current Smoker   Visit Components: Discussion included one or more decision making aids. yes Discussion included risk/benefits of screening. yes Discussion included potential follow up diagnostic testing for abnormal scans. yes Discussion included meaning and risk of over diagnosis. yes Discussion included meaning and risk of False Positives. yes Discussion included meaning of total radiation exposure. yes  Counseling Included: Importance of adherence to annual lung cancer LDCT screening. yes Impact of comorbidities on ability to participate in the program. yes Ability and willingness to under diagnostic treatment. yes  Smoking Cessation Counseling: Current Smokers:  Discussed importance of smoking cessation. yes Information about tobacco cessation classes and interventions provided to patient. yes Patient provided with "ticket" for LDCT Scan. N/a Symptomatic Patient.  no  Counseling Diagnosis Code: Tobacco Use Z72.0 Asymptomatic Patient yes  Counseling (Intermediate counseling: > three minutes counseling) [id number] Information about tobacco cessation classes and interventions provided to patient. Yes Patient provided with "ticket" for LDCT Scan. N/a Written Order for Lung Cancer Screening with LDCT placed in Epic. Yes (CT Chest Lung Cancer Screening Low Dose W/O CM) [id number] Z12.2-Screening of respiratory organs Z87.891-Personal history of nicotine dependence   I have spent 25 minutes of face to face/ virtual visit   time with the patient discussing the risks and benefits of lung cancer screening. We viewed / discussed a power point together that explained in detail the above noted topics. We paused at intervals to allow for questions to be asked and answered to ensure understanding.We discussed that the single most powerful action that he can take to decrease his risk of developing lung cancer is to quit smoking. We discussed whether or not he is ready to commit to setting a quit date. We discussed options for tools to aid in quitting smoking including nicotine replacement therapy, non-nicotine medications, support groups, Quit Smart classes, and behavior modification. We discussed that often times setting smaller, more achievable goals, such as eliminating 1 cigarette a day for a week and then 2 cigarettes a day for a week can be helpful in slowly decreasing the number of cigarettes smoked. This allows for a sense of accomplishment as well as providing a clinical benefit. I provided  him  with smoking cessation  information  with contact information for community resources, classes, free nicotine replacement therapy, and access to mobile apps, text messaging, and on-line smoking cessation help. I have also provided  him  the office contact information in the event he needs to contact me, or the screening staff. We discussed  the time and location of the scan, and that  either Doroteo Glassman RN, Joella Prince, RN  or I will call / send a letter with the results within 24-72 hours of receiving them. The patient verbalized understanding of all of  the above and had no further questions upon leaving the office. They have my contact information in the event they have any further questions.  I spent three minutes counseling on smoking cessation and the health risks of continued tobacco abuse.  I explained to the patient that there has been a high incidence of coronary artery disease noted on these exams. I explained that this is a non-gated exam therefore degree or severity cannot be determined. This patient is not on statin therapy. I have asked the patient to follow-up with their PCP regarding any incidental finding of coronary artery disease and management with diet or medication as their PCP  feels is clinically indicated. The patient verbalized understanding of the above and had no further questions upon completion of the visit.      Otilio Carpen Thursa Emme, PA-C

## 2022-05-27 NOTE — Patient Instructions (Signed)
Thank you for participating in the [facility]. It was our pleasure to meet you today. We will call you with the results of your scan within the next few days. Your scan will be assigned a Lung RADS category score by the physicians reading the scans.  This Lung RADS score determines follow up scanning.  See below for description of categories, and follow up screening recommendations. We will be in touch to schedule your follow up screening annually or based on recommendations of our providers. We will fax a copy of your scan results to your Primary Care Physician, or the physician who referred you to the program, to ensure they have the results. Please call the office if you have any questions or concerns regarding your scanning experience or results.  Our office number is [phone]. Please speak with Denise Phelps, RN. , or  Denise Buckner RN, They are  our Lung Cancer Screening RN.'s If They are unavailable when you call, Please leave a message on the voice mail. We will return your call at our earliest convenience.This voice mail is monitored several times a day.  Remember, if your scan is normal, we will scan you annually as long as you continue to meet the criteria for the program. (Age 50-80, Current smoker or smoker who has quit within the last 15 years). If you are a smoker, remember, quitting is the single most powerful action that you can take to decrease your risk of lung cancer and other pulmonary, breathing related problems. We know quitting is hard, and we are here to help.  Please let us know if there is anything we can do to help you meet your goal of quitting. If you are a former smoker, congratulations. We are proud of you! Remain smoke free! Remember you can refer friends or family members through the number above.  We will screen them to make sure they meet criteria for the program. Thank you for helping us take better care of you by  participating in Lung Screening.  You can receive free nicotine replacement therapy ( patches, gum or mints) by calling 1-800-QUIT NOW. Please call so we can get you on the path to becoming  a non-smoker. I know it is hard, but you can do this!  Lung RADS Categories:  Lung RADS 1: no nodules or definitely non-concerning nodules.  Recommendation is for a repeat annual scan in 12 months.  Lung RADS 2:  nodules that are non-concerning in appearance and behavior with a very low likelihood of becoming an active cancer. Recommendation is for a repeat annual scan in 12 months.  Lung RADS 3: nodules that are probably non-concerning , includes nodules with a low likelihood of becoming an active cancer.  Recommendation is for a 6-month repeat screening scan. Often noted after an upper respiratory illness. We will be in touch to make sure you have no questions, and to schedule your 6-month scan.  Lung RADS 4 A: nodules with concerning findings, recommendation is most often for a follow up scan in 3 months or additional testing based on our provider's assessment of the scan. We will be in touch to make sure you have no questions and to schedule the recommended 3 month follow up scan.  Lung RADS 4 B:  indicates findings that are concerning. We will be in touch with you to schedule additional diagnostic testing based on our provider's  assessment of the scan.  Other options for assistance in smoking cessation (   As covered by your insurance benefits)  Hypnosis for smoking cessation  [facility] [phone]  Acupuncture for smoking cessation  [facility] [phone]   

## 2022-05-28 ENCOUNTER — Ambulatory Visit (HOSPITAL_COMMUNITY)
Admission: RE | Admit: 2022-05-28 | Discharge: 2022-05-28 | Disposition: A | Discharge status: Home / Self Care | Payer: 59 | Source: Ambulatory Visit | Attending: Acute Care | Admitting: Acute Care

## 2022-05-28 DIAGNOSIS — Z122 Encounter for screening for malignant neoplasm of respiratory organs: Secondary | ICD-10-CM | POA: Diagnosis not present

## 2022-05-28 DIAGNOSIS — Z87891 Personal history of nicotine dependence: Secondary | ICD-10-CM | POA: Insufficient documentation

## 2022-05-28 DIAGNOSIS — F17210 Nicotine dependence, cigarettes, uncomplicated: Secondary | ICD-10-CM

## 2022-05-30 ENCOUNTER — Telehealth: Payer: Self-pay | Admitting: *Deleted

## 2022-05-30 DIAGNOSIS — Z87891 Personal history of nicotine dependence: Secondary | ICD-10-CM

## 2022-05-30 DIAGNOSIS — R911 Solitary pulmonary nodule: Secondary | ICD-10-CM

## 2022-05-30 NOTE — Telephone Encounter (Signed)
Spoke with pt regarding CT results. PT advised that there were a few tiny nodules seen that we would like to look at again in 6 months.  Coronary calcifications seen. PT will follow up with PCP.  PT verbalized understanding and is aware we will call him closer to 6 month to schedule follow up CT. Results/ plans faxed to PCP. Order placed for 6 mth nodule f/u [facility] CT.

## 2022-07-16 ENCOUNTER — Ambulatory Visit: Payer: 59 | Admitting: Dermatology

## 2022-07-16 VITALS — BP 125/77

## 2022-07-16 DIAGNOSIS — L821 Other seborrheic keratosis: Secondary | ICD-10-CM | POA: Diagnosis not present

## 2022-07-16 DIAGNOSIS — D229 Melanocytic nevi, unspecified: Secondary | ICD-10-CM

## 2022-07-16 DIAGNOSIS — L570 Actinic keratosis: Secondary | ICD-10-CM

## 2022-07-16 DIAGNOSIS — L578 Other skin changes due to chronic exposure to nonionizing radiation: Secondary | ICD-10-CM

## 2022-07-16 DIAGNOSIS — L814 Other melanin hyperpigmentation: Secondary | ICD-10-CM | POA: Diagnosis not present

## 2022-07-16 DIAGNOSIS — Z8589 Personal history of malignant neoplasm of other organs and systems: Secondary | ICD-10-CM

## 2022-07-16 DIAGNOSIS — L820 Inflamed seborrheic keratosis: Secondary | ICD-10-CM

## 2022-07-16 DIAGNOSIS — Z85828 Personal history of other malignant neoplasm of skin: Secondary | ICD-10-CM

## 2022-07-16 DIAGNOSIS — Z1283 Encounter for screening for malignant neoplasm of skin: Secondary | ICD-10-CM

## 2022-07-16 NOTE — Patient Instructions (Signed)
Cryotherapy Aftercare  Wash gently with soap and water everyday.   Apply Vaseline and Band-Aid daily until healed.     Due to recent changes in healthcare laws, you may see results of your pathology and/or laboratory studies on MyChart before the doctors have had a chance to review them. We understand that in some cases there may be results that are confusing or concerning to you. Please understand that not all results are received at the same time and often the doctors may need to interpret multiple results in order to provide you with the best plan of care or course of treatment. Therefore, we ask that you please give us 2 business days to thoroughly review all your results before contacting the office for clarification. Should we see a critical lab result, you will be contacted sooner.   If You Need Anything After Your Visit  If you have any questions or concerns for your doctor, please call our main line at [phone] and press option 4 to reach your doctor's medical assistant. If no one answers, please leave a voicemail as directed and we will return your call as soon as possible. Messages left after 4 pm will be answered the following business day.   You may also send us a message via MyChart. We typically respond to MyChart messages within 1-2 business days.  For prescription refills, please ask your pharmacy to contact our office. Our fax number is [phone].  If you have an urgent issue when the clinic is closed that cannot wait until the next business day, you can page your doctor at the number below.    Please note that while we do our best to be available for urgent issues outside of office hours, we are not available 24/7.   If you have an urgent issue and are unable to reach us, you may choose to seek medical care at your doctor's office, retail clinic, urgent care center, or emergency room.  If you have a medical emergency, please immediately call 911 or go to the  emergency department.  Pager Numbers  - Dr. Kowalski: [phone]  - Dr. Moye: [phone]  - Dr. Stewart: [phone]  In the event of inclement weather, please call our main line at [phone] for an update on the status of any delays or closures.  Dermatology Medication Tips: Please keep the boxes that topical medications come in in order to help keep track of the instructions about where and how to use these. Pharmacies typically print the medication instructions only on the boxes and not directly on the medication tubes.   If your medication is too expensive, please contact our office at [phone] option 4 or send us a message through MyChart.   We are unable to tell what your co-pay for medications will be in advance as this is different depending on your insurance coverage. However, we may be able to find a substitute medication at lower cost or fill out paperwork to get insurance to cover a needed medication.   If a prior authorization is required to get your medication covered by your insurance company, please allow us 1-2 business days to complete this process.  Drug prices often vary depending on where the prescription is filled and some pharmacies may offer cheaper prices.  The website www.goodrx.com contains coupons for medications through different pharmacies. The prices here do not account for what the cost may be with help from insurance (it may be cheaper with your insurance), but the website can   give you the price if you did not use any insurance.  - You can print the associated coupon and take it with your prescription to the pharmacy.  - You may also stop by our office during regular business hours and pick up a GoodRx coupon card.  - If you need your prescription sent electronically to a different pharmacy, notify our office through [facility] MyChart or by phone at [phone] option 4.     Si Usted Necesita Algo Despus de Su Visita  Tambin puede  enviarnos un mensaje a travs de MyChart. Por lo general respondemos a los mensajes de MyChart en el transcurso de 1 a 2 das hbiles.  Para renovar recetas, por favor pida a su farmacia que se ponga en contacto con nuestra oficina. Nuestro nmero de fax es el [phone].  Si tiene un asunto urgente cuando la clnica est cerrada y que no puede esperar hasta el siguiente da hbil, puede llamar/localizar a su doctor(a) al nmero que aparece a continuacin.   Por favor, tenga en cuenta que aunque hacemos todo lo posible para estar disponibles para asuntos urgentes fuera del horario de oficina, no estamos disponibles las 24 horas del da, los 7 das de la semana.   Si tiene un problema urgente y no puede comunicarse con nosotros, puede optar por buscar atencin mdica  en el consultorio de su doctor(a), en una clnica privada, en un centro de atencin urgente o en una sala de emergencias.  Si tiene una emergencia mdica, por favor llame inmediatamente al 911 o vaya a la sala de emergencias.  Nmeros de bper  - Dr. Kowalski: [phone]  - Dra. Moye: [phone]  - Dra. Stewart: [phone]  En caso de inclemencias del tiempo, por favor llame a nuestra lnea principal al [phone] para una actualizacin sobre el estado de cualquier retraso o cierre.  Consejos para la medicacin en dermatologa: Por favor, guarde las cajas en las que vienen los medicamentos de uso tpico para ayudarle a seguir las instrucciones sobre dnde y cmo usarlos. Las farmacias generalmente imprimen las instrucciones del medicamento slo en las cajas y no directamente en los tubos del medicamento.   Si su medicamento es muy caro, por favor, pngase en contacto con nuestra oficina llamando al [phone] y presione la opcin 4 o envenos un mensaje a travs de MyChart.   No podemos decirle cul ser su copago por los medicamentos por adelantado ya que esto es diferente dependiendo de la cobertura de su seguro.  Sin embargo, es posible que podamos encontrar un medicamento sustituto a menor costo o llenar un formulario para que el seguro cubra el medicamento que se considera necesario.   Si se requiere una autorizacin previa para que su compaa de seguros cubra su medicamento, por favor permtanos de 1 a 2 das hbiles para completar este proceso.  Los precios de los medicamentos varan con frecuencia dependiendo del lugar de dnde se surte la receta y alguna farmacias pueden ofrecer precios ms baratos.  El sitio web www.goodrx.com tiene cupones para medicamentos de diferentes farmacias. Los precios aqu no tienen en cuenta lo que podra costar con la ayuda del seguro (puede ser ms barato con su seguro), pero el sitio web puede darle el precio si no utiliz ningn seguro.  - Puede imprimir el cupn correspondiente y llevarlo con su receta a la farmacia.  - Tambin puede pasar por nuestra oficina durante el horario de atencin regular y recoger una tarjeta de cupones de GoodRx.  -   Si necesita que su receta se enve electrnicamente a una farmacia diferente, informe a nuestra oficina a travs de MyChart de [place] o por telfono llamando al [phone] y presione la opcin 4.  

## 2022-07-16 NOTE — Progress Notes (Signed)
New Patient Visit   Subjective  Andrew Vargas is a 62 y.o. male who presents for the following: Skin Cancer Screening and Full Body Skin Exam - History of SCC - Former patient of Dr. Jorja Loa  The patient presents for Total-Body Skin Exam (TBSE) for skin cancer screening and mole check. The patient has spots, moles and lesions to be evaluated, some may be new or changing and the patient has concerns that these could be cancer.    The following portions of the chart were reviewed this encounter and updated as appropriate: medications, allergies, medical history  Review of Systems:  No other skin or systemic complaints except as noted in HPI or Assessment and Plan.  Objective  Well appearing patient in no apparent distress; mood and affect are within normal limits.  A full examination was performed including scalp, head, eyes, ears, nose, lips, neck, chest, axillae, abdomen, back, buttocks, bilateral upper extremities, bilateral lower extremities, hands, feet, fingers, toes, fingernails, and toenails. All findings within normal limits unless otherwise noted below.   Relevant physical exam findings are noted in the Assessment and Plan.  Scalp, face (6) Erythematous thin papules/macules with gritty scale.   Right cheek x 1, chest x 1 (2) Erythematous stuck-on, waxy papule or plaque    Assessment & Plan   HISTORY OF SQUAMOUS CELL CARCINOMA OF THE SKIN - No evidence of recurrence today - No lymphadenopathy - Recommend regular full body skin exams - Recommend daily broad spectrum sunscreen SPF 30+ to sun-exposed areas, reapply every 2 hours as needed.  - Call if any new or changing lesions are noted between office visits   LENTIGINES, SEBORRHEIC KERATOSES, HEMANGIOMAS - Benign normal skin lesions - Benign-appearing - Call for any changes  MELANOCYTIC NEVI - Tan-brown and/or pink-flesh-colored symmetric macules and papules - Benign appearing on exam today - Observation - Call  clinic for new or changing moles - Recommend daily use of broad spectrum spf 30+ sunscreen to sun-exposed areas.   ACTINIC DAMAGE - Chronic condition, secondary to cumulative UV/sun exposure - diffuse scaly erythematous macules with underlying dyspigmentation - Recommend daily broad spectrum sunscreen SPF 30+ to sun-exposed areas, reapply every 2 hours as needed.  - Staying in the shade or wearing long sleeves, sun glasses (UVA+UVB protection) and wide brim hats (4-inch brim around the entire circumference of the hat) are also recommended for sun protection.  - Call for new or changing lesions.  SKIN CANCER SCREENING PERFORMED TODAY.    AK (actinic keratosis) (6) Scalp, face  Destruction of lesion - Scalp, face Complexity: simple   Destruction method: cryotherapy   Informed consent: discussed and consent obtained   Timeout:  patient name, date of birth, surgical site, and procedure verified Lesion destroyed using liquid nitrogen: Yes   Region frozen until ice ball extended beyond lesion: Yes   Outcome: patient tolerated procedure well with no complications   Post-procedure details: wound care instructions given    Inflamed seborrheic keratosis (2) Right cheek x 1, chest x 1  Destruction of lesion - Right cheek x 1, chest x 1 Complexity: simple   Destruction method: cryotherapy   Informed consent: discussed and consent obtained   Timeout:  patient name, date of birth, surgical site, and procedure verified Lesion destroyed using liquid nitrogen: Yes   Region frozen until ice ball extended beyond lesion: Yes   Outcome: patient tolerated procedure well with no complications   Post-procedure details: wound care instructions given    Destruction of  lesion - Right cheek x 1, chest x 1    Return in about 6 months (around 01/15/2023) for UBSE.  I, Joanie Coddington, CMA, am acting as scribe for Armida Sans, MD .   Documentation: I have reviewed the above documentation for  accuracy and completeness, and I agree with the above.  Armida Sans, MD

## 2022-07-23 ENCOUNTER — Encounter: Payer: Self-pay | Admitting: Dermatology

## 2022-08-15 IMAGING — CT CT ABD-PELV W/ CM
2 of 5 series · 16 of 46 positions shown, 18 images · IV contrast (Omnipaque or Isovue)
Comparison: None.

CLINICAL DATA: Left-sided abdominal pain status post antibiotic
therapy for diverticulitis.

EXAM:
CT ABDOMEN AND PELVIS WITH CONTRAST
TECHNIQUE: Multidetector CT imaging of the abdomen and pelvis was performed
using the standard protocol following bolus administration of
intravenous contrast.
CONTRAST:  100mL OMNIPAQUE IOHEXOL 300 MG/ML  SOLN

[Series 2: axial st · axial · 0.82mm/px · z∈[+1117,+1597]mm · 13 of 110 slices shown, 15 images]
[im 7/110  soft-tissue]
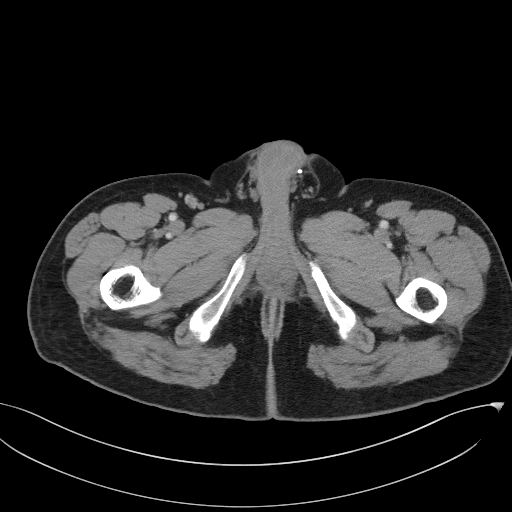
[im 7/110  bone]
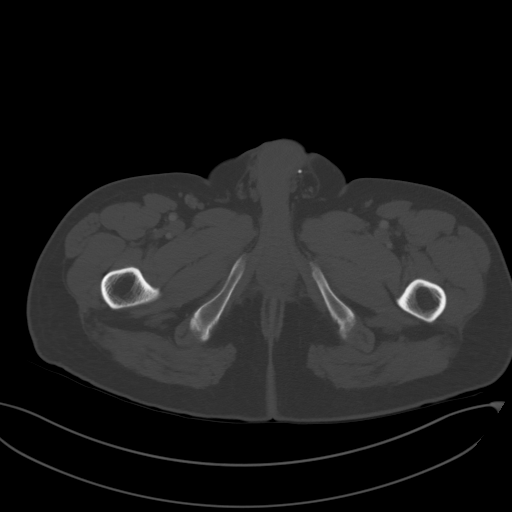
[im 13/110  soft-tissue]
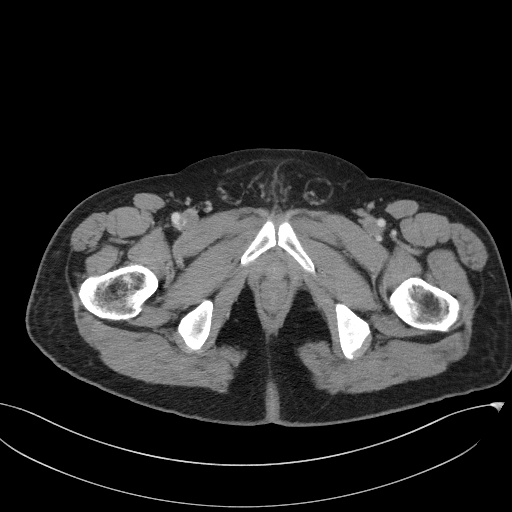
[im 25/110  soft-tissue]
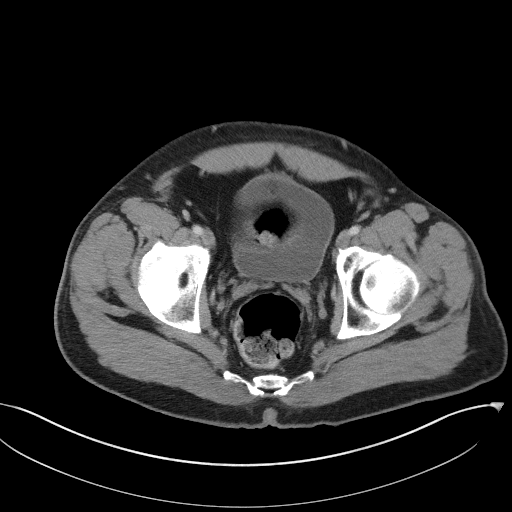
[im 31/110  soft-tissue]
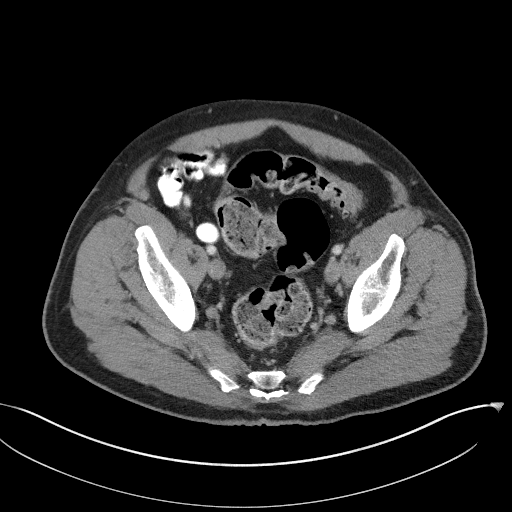
[im 37/110  soft-tissue]
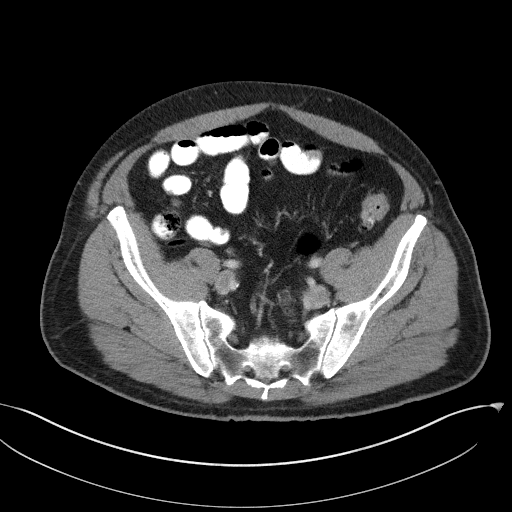
[im 49/110  soft-tissue]
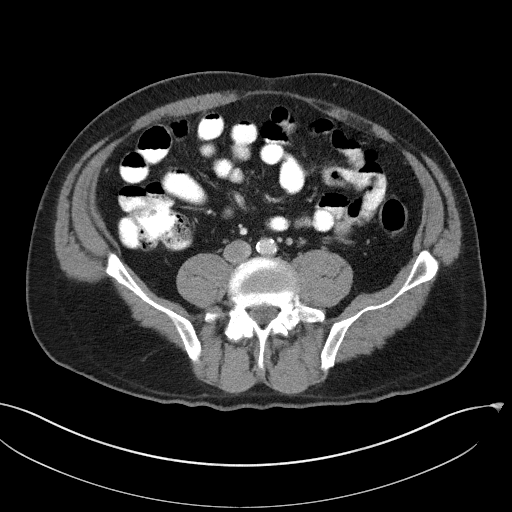
[im 55/110  soft-tissue]
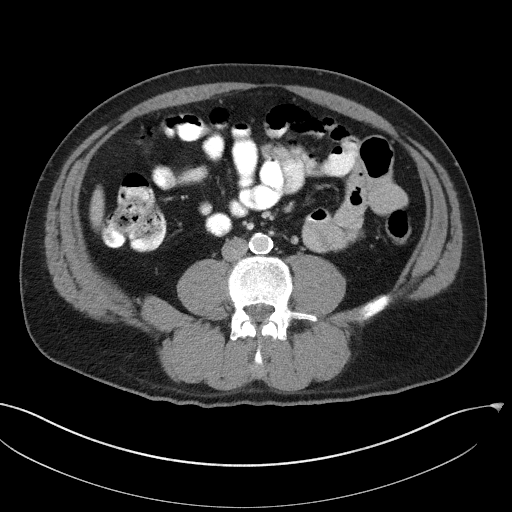
[im 61/110  soft-tissue]
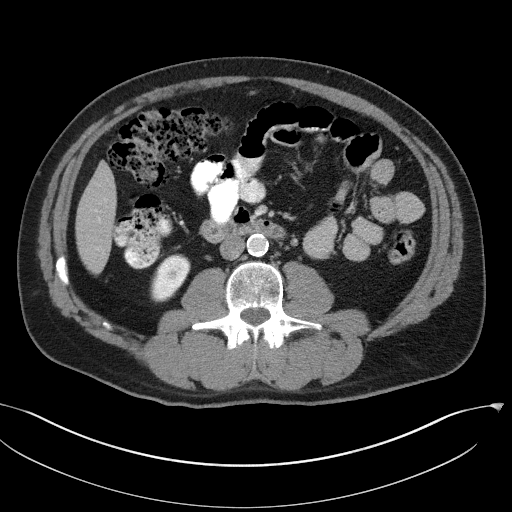
[im 73/110  soft-tissue]
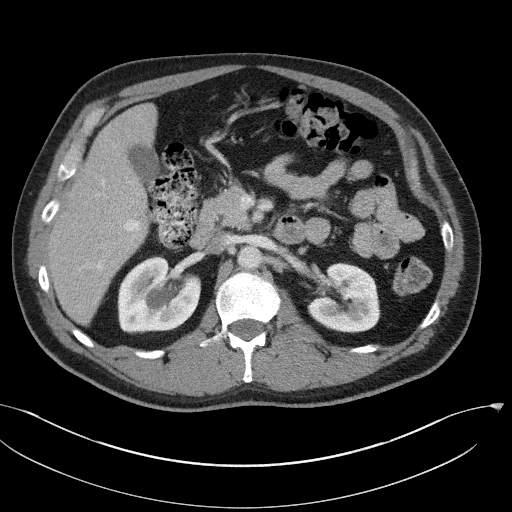
[im 73/110  bone]
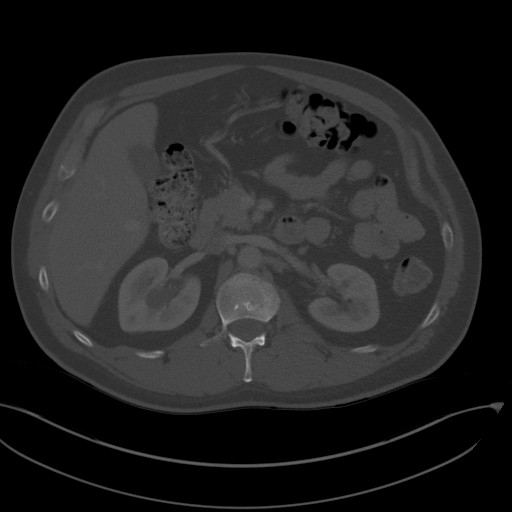
[im 79/110  soft-tissue]
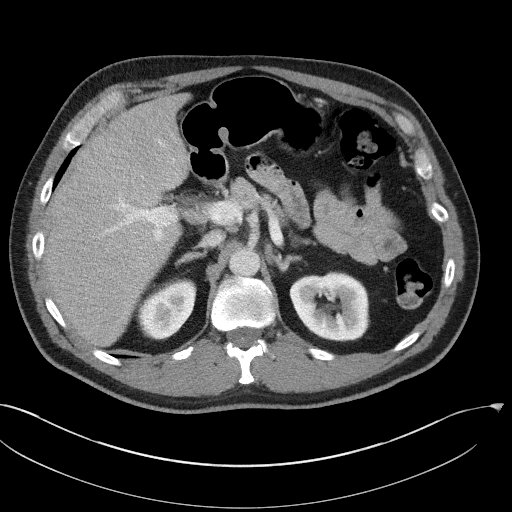
[im 85/110  soft-tissue]
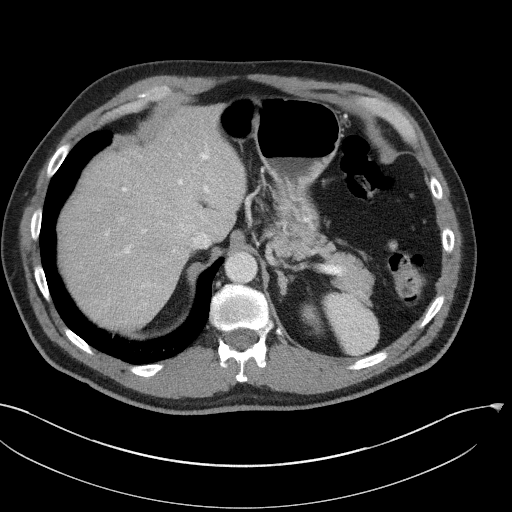
[im 97/110  soft-tissue]
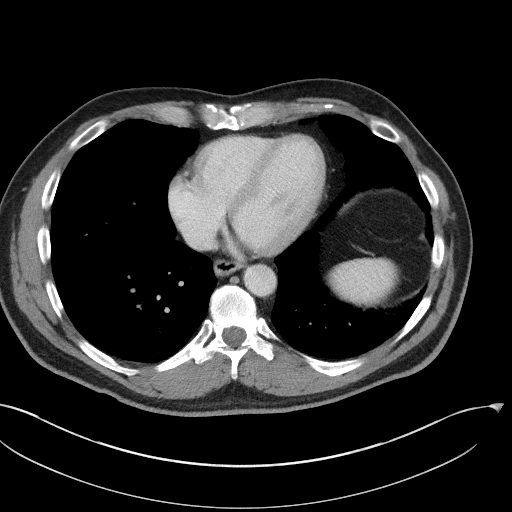
[im 103/110  soft-tissue]
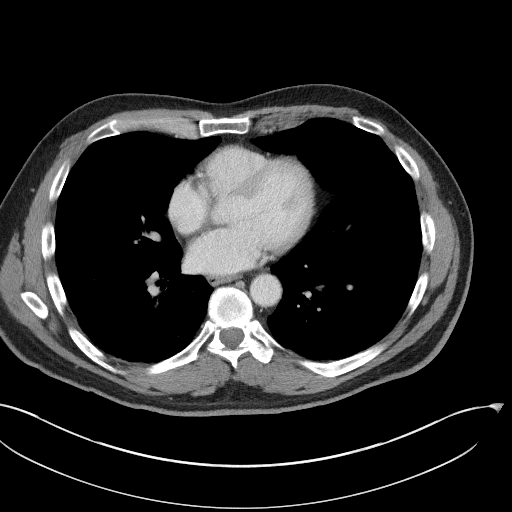

[Series 6: coronal st · coronal · 0.96mm/px · 3 of 115 slices shown]
[im 39/115  soft-tissue]
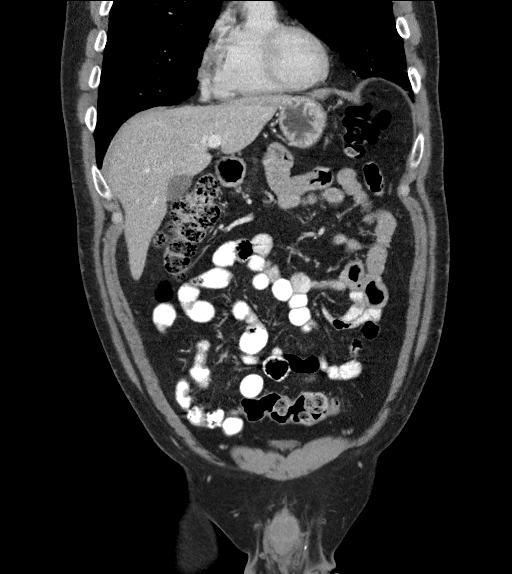
[im 51/115  soft-tissue]
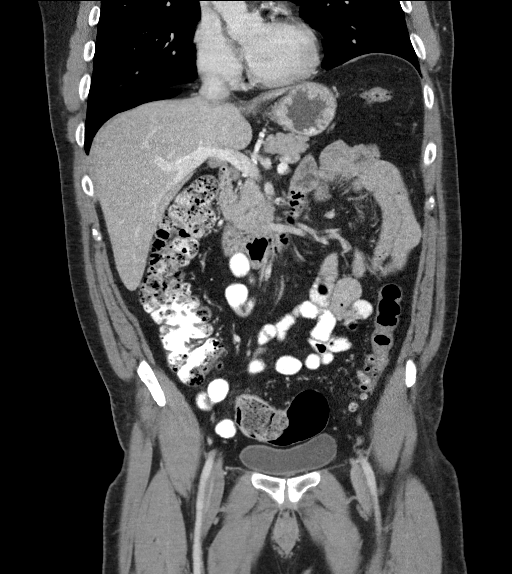
[im 64/115  soft-tissue]
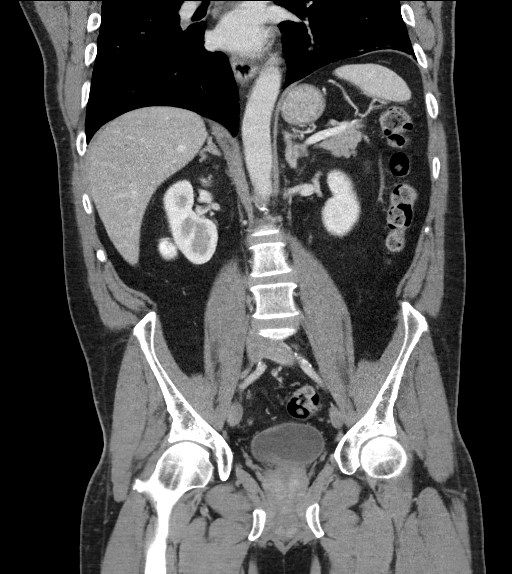

[16 of 46 positions shown; findings below may reference images not displayed]

FINDINGS: Lower chest: Bibasilar atelectasis.

Hepatobiliary: No suspicious hepatic lesion. Gallbladder is
unremarkable. No biliary ductal dilation.

Pancreas: No pancreatic ductal dilation or evidence of acute
inflammation.

Spleen: Within normal limits.

Adrenals/Urinary Tract: Bilateral adrenal glands are unremarkable.
No hydronephrosis. 2.9 cm hypodense right renal cyst. No solid
enhancing renal mass. Urinary bladder is unremarkable for degree of
distension.

Stomach/Bowel: Radiopaque enteric contrast traverses the ascending
colon. Small hiatal hernia otherwise the stomach is unremarkable. No
pathologic dilation of small or large bowel. The appendix and
terminal appear. Colonic diverticulosis with a short segment of
sigmoid colonic wall thickening and inflammation. No pneumatosis.

Vascular/Lymphatic: Aortic atherosclerosis without abdominal aortic
aneurysm. No pathologically enlarged abdominopelvic lymph nodes.

Reproductive: Prostate is unremarkable.

Other: No significant abdominopelvic ascites. No walled off fluid
collections. No pneumoperitoneum.

Musculoskeletal: No acute or significant osseous findings.
IMPRESSION: CT findings which likely reflect mild acute uncomplicated sigmoid
diverticulitis/colitis.

Aortic Atherosclerosis ([id number]).

## 2022-09-16 ENCOUNTER — Other Ambulatory Visit (HOSPITAL_COMMUNITY): Payer: Self-pay

## 2022-11-13 ENCOUNTER — Ambulatory Visit: Payer: 59 | Admitting: Family Medicine

## 2022-11-26 ENCOUNTER — Telehealth: Payer: Self-pay | Admitting: Acute Care

## 2022-11-26 ENCOUNTER — Telehealth: Payer: Self-pay | Admitting: Family Medicine

## 2022-11-26 ENCOUNTER — Other Ambulatory Visit: Payer: Self-pay | Admitting: Family Medicine

## 2022-11-26 MED ORDER — NIRMATRELVIR/RITONAVIR (PAXLOVID)TABLET: ORAL_TABLET | ORAL | 0 refills | Status: DC

## 2022-11-26 NOTE — Telephone Encounter (Signed)
Patient tested positive for Covid this morning  with cough,headache. Sinus pressure,chest congestion.,chills.[facility]

## 2022-11-26 NOTE — Telephone Encounter (Signed)
Spoke with wife and made aware of drs orders and recommendations.

## 2022-11-26 NOTE — Telephone Encounter (Signed)
Patient currently has a cold but is scheduled for a CT scan on Thursday and is wondering if it is okay for him to still get the scan done.

## 2022-11-26 NOTE — Telephone Encounter (Signed)
Spoke with patient and he reports congestion/cough for several days; denies fever/chills, other symptoms. Took at-home COVID test yesterday and it was NEG. Recommended repeating COVID test today or tomorrow, just to be sure. Also advised if he develops fever/chills or increased symptoms he should contact PCP or our office for recommendations, and would likely need to r/s CT scan. Patient voiced understanding. Nothing further needed at this time.

## 2022-11-28 ENCOUNTER — Ambulatory Visit (HOSPITAL_COMMUNITY): Payer: 59

## 2022-12-17 ENCOUNTER — Telehealth: Payer: Self-pay | Admitting: Acute Care

## 2022-12-17 NOTE — Telephone Encounter (Signed)
Patient would like to reschedule CT scan. Patient phone number is [phone].

## 2022-12-17 NOTE — Telephone Encounter (Signed)
Hey I got this patients ct resc for 10/14 but I noticed he was down for a SDMV also

## 2022-12-18 NOTE — Telephone Encounter (Signed)
Patient aware of appt

## 2022-12-31 ENCOUNTER — Encounter: Payer: 59 | Admitting: Family Medicine

## 2023-01-06 ENCOUNTER — Ambulatory Visit (HOSPITAL_COMMUNITY)
Admission: RE | Admit: 2023-01-06 | Discharge: 2023-01-06 | Disposition: A | Discharge status: Home / Self Care | Payer: 59 | Source: Ambulatory Visit | Attending: Acute Care | Admitting: Acute Care

## 2023-01-06 DIAGNOSIS — Z87891 Personal history of nicotine dependence: Secondary | ICD-10-CM | POA: Insufficient documentation

## 2023-01-06 DIAGNOSIS — R911 Solitary pulmonary nodule: Secondary | ICD-10-CM | POA: Diagnosis not present

## 2023-01-06 DIAGNOSIS — F17210 Nicotine dependence, cigarettes, uncomplicated: Secondary | ICD-10-CM | POA: Diagnosis not present

## 2023-01-14 ENCOUNTER — Telehealth: Payer: Self-pay | Admitting: Acute Care

## 2023-01-14 NOTE — Telephone Encounter (Signed)
Returned call to patient from VM who was inquiring about LDCT results  Results are pending.  Advised our results are slower than normal being released and we would notify him once received and reviewed.  Patient verbalized understanding

## 2023-01-15 ENCOUNTER — Ambulatory Visit: Payer: 59 | Admitting: Dermatology

## 2023-01-15 DIAGNOSIS — Z8589 Personal history of malignant neoplasm of other organs and systems: Secondary | ICD-10-CM

## 2023-01-15 DIAGNOSIS — Z85828 Personal history of other malignant neoplasm of skin: Secondary | ICD-10-CM | POA: Diagnosis not present

## 2023-01-15 DIAGNOSIS — Z1283 Encounter for screening for malignant neoplasm of skin: Secondary | ICD-10-CM

## 2023-01-15 DIAGNOSIS — L578 Other skin changes due to chronic exposure to nonionizing radiation: Secondary | ICD-10-CM | POA: Diagnosis not present

## 2023-01-15 DIAGNOSIS — L570 Actinic keratosis: Secondary | ICD-10-CM | POA: Diagnosis not present

## 2023-01-15 DIAGNOSIS — D229 Melanocytic nevi, unspecified: Secondary | ICD-10-CM

## 2023-01-15 DIAGNOSIS — W908XXA Exposure to other nonionizing radiation, initial encounter: Secondary | ICD-10-CM

## 2023-01-15 DIAGNOSIS — L821 Other seborrheic keratosis: Secondary | ICD-10-CM

## 2023-01-15 DIAGNOSIS — Z86007 Personal history of in-situ neoplasm of skin: Secondary | ICD-10-CM

## 2023-01-15 DIAGNOSIS — Z872 Personal history of diseases of the skin and subcutaneous tissue: Secondary | ICD-10-CM

## 2023-01-15 DIAGNOSIS — D1801 Hemangioma of skin and subcutaneous tissue: Secondary | ICD-10-CM | POA: Diagnosis not present

## 2023-01-15 DIAGNOSIS — L814 Other melanin hyperpigmentation: Secondary | ICD-10-CM

## 2023-01-15 DIAGNOSIS — L820 Inflamed seborrheic keratosis: Secondary | ICD-10-CM

## 2023-01-15 NOTE — Progress Notes (Signed)
Follow-Up Visit   Subjective  Andrew Vargas is a 62 y.o. male who presents for the following: Skin Cancer Screening and Upper Body Skin Exam hx of aks, hx of bcc hx of scc and sccis  Concerned about itchy spot he noticed a month to 2 months ago.   The patient presents for Upper Body Skin Exam (UBSE) for skin cancer screening and mole check. The patient has spots, moles and lesions to be evaluated, some may be new or changing and the patient may have concern these could be cancer.    The following portions of the chart were reviewed this encounter and updated as appropriate: medications, allergies, medical history  Review of Systems:  No other skin or systemic complaints except as noted in HPI or Assessment and Plan.  Objective  Well appearing patient in no apparent distress; mood and affect are within normal limits.  All skin waist up examined. Relevant physical exam findings are noted in the Assessment and Plan.  left chest x 1, back x2, left shoulder x 1, right temple x 1 (5) Erythematous stuck-on, waxy papule or plaque  scalp and face x 15 (15) Erythematous thin papules/macules with gritty scale.     Assessment & Plan   Inflamed seborrheic keratosis (5) left chest x 1, back x2, left shoulder x 1, right temple x 1  Symptomatic, irritating, patient would like treated.  Destruction of lesion - left chest x 1, back x2, left shoulder x 1, right temple x 1 (5) Complexity: simple   Destruction method: cryotherapy   Informed consent: discussed and consent obtained   Timeout:  patient name, date of birth, surgical site, and procedure verified Lesion destroyed using liquid nitrogen: Yes   Region frozen until ice ball extended beyond lesion: Yes   Outcome: patient tolerated procedure well with no complications   Post-procedure details: wound care instructions given    Actinic keratosis (15) scalp and face x 15  Actinic keratoses are precancerous spots that appear secondary  to cumulative UV radiation exposure/sun exposure over time. They are chronic with expected duration over 1 year. A portion of actinic keratoses will progress to squamous cell carcinoma of the skin. It is not possible to reliably predict which spots will progress to skin cancer and so treatment is recommended to prevent development of skin cancer.  Recommend daily broad spectrum sunscreen SPF 30+ to sun-exposed areas, reapply every 2 hours as needed.  Recommend staying in the shade or wearing long sleeves, sun glasses (UVA+UVB protection) and wide brim hats (4-inch brim around the entire circumference of the hat). Call for new or changing lesions.  Destruction of lesion - scalp and face x 15 (15) Complexity: simple   Destruction method: cryotherapy   Informed consent: discussed and consent obtained   Timeout:  patient name, date of birth, surgical site, and procedure verified Lesion destroyed using liquid nitrogen: Yes   Region frozen until ice ball extended beyond lesion: Yes   Outcome: patient tolerated procedure well with no complications   Post-procedure details: wound care instructions given     Skin cancer screening performed today.  Actinic Damage - Chronic condition, secondary to cumulative UV/sun exposure - diffuse scaly erythematous macules with underlying dyspigmentation - Recommend daily broad spectrum sunscreen SPF 30+ to sun-exposed areas, reapply every 2 hours as needed.  - Staying in the shade or wearing long sleeves, sun glasses (UVA+UVB protection) and wide brim hats (4-inch brim around the entire circumference of the hat) are also  recommended for sun protection.  - Call for new or changing lesions.  Lentigines, Seborrheic Keratoses, Hemangiomas - Benign normal skin lesions - Benign-appearing - Call for any changes  Melanocytic Nevi - Tan-brown and/or pink-flesh-colored symmetric macules and papules - Benign appearing on exam today - Observation - Call clinic for  new or changing moles - Recommend daily use of broad spectrum spf 30+ sunscreen to sun-exposed areas.   HISTORY OF SQUAMOUS CELL CARCINOMA OF THE SKIN See history  - No evidence of recurrence today - No lymphadenopathy - Recommend regular full body skin exams - Recommend daily broad spectrum sunscreen SPF 30+ to sun-exposed areas, reapply every 2 hours as needed.  - Call if any new or changing lesions are noted between office visits   HISTORY OF SQUAMOUS CELL CARCINOMA IN SITU OF THE SKIN See history - No evidence of recurrence today - Recommend regular full body skin exams - Recommend daily broad spectrum sunscreen SPF 30+ to sun-exposed areas, reapply every 2 hours as needed.  - Call if any new or changing lesions are noted between office visits   Return for 6 - 8 month tbse .  IAsher Muir, CMA, am acting as scribe for Armida Sans, MD.   Documentation: I have reviewed the above documentation for accuracy and completeness, and I agree with the above.  Armida Sans, MD

## 2023-01-15 NOTE — Patient Instructions (Addendum)

## 2023-01-26 ENCOUNTER — Encounter: Payer: Self-pay | Admitting: Dermatology

## 2023-01-31 ENCOUNTER — Other Ambulatory Visit: Payer: Self-pay

## 2023-01-31 DIAGNOSIS — F17210 Nicotine dependence, cigarettes, uncomplicated: Secondary | ICD-10-CM

## 2023-01-31 DIAGNOSIS — Z122 Encounter for screening for malignant neoplasm of respiratory organs: Secondary | ICD-10-CM

## 2023-01-31 DIAGNOSIS — Z87891 Personal history of nicotine dependence: Secondary | ICD-10-CM

## 2023-01-31 NOTE — Telephone Encounter (Signed)
Contacted patient by phone to advise results were released and he confirmed he has noted the report in mychart.  Nodules stable and will schedule next LDCT in one year.  Patient acknowledged understanding.

## 2023-02-03 ENCOUNTER — Other Ambulatory Visit (HOSPITAL_COMMUNITY): Payer: Self-pay

## 2023-02-03 ENCOUNTER — Other Ambulatory Visit: Payer: Self-pay

## 2023-02-11 ENCOUNTER — Other Ambulatory Visit (HOSPITAL_COMMUNITY): Payer: Self-pay

## 2023-05-20 ENCOUNTER — Other Ambulatory Visit (HOSPITAL_COMMUNITY): Payer: Self-pay

## 2023-05-20 ENCOUNTER — Ambulatory Visit (INDEPENDENT_AMBULATORY_CARE_PROVIDER_SITE_OTHER): Payer: 59 | Admitting: Family Medicine

## 2023-05-20 ENCOUNTER — Encounter: Payer: Self-pay | Admitting: Family Medicine

## 2023-05-20 VITALS — HR 88 | Temp 98.0°F | Ht 72.0 in

## 2023-05-20 DIAGNOSIS — E7800 Pure hypercholesterolemia, unspecified: Secondary | ICD-10-CM

## 2023-05-20 DIAGNOSIS — D582 Other hemoglobinopathies: Secondary | ICD-10-CM

## 2023-05-20 DIAGNOSIS — K219 Gastro-esophageal reflux disease without esophagitis: Secondary | ICD-10-CM

## 2023-05-20 DIAGNOSIS — Z0001 Encounter for general adult medical examination with abnormal findings: Secondary | ICD-10-CM | POA: Diagnosis not present

## 2023-05-20 DIAGNOSIS — Z13228 Encounter for screening for other metabolic disorders: Secondary | ICD-10-CM

## 2023-05-20 DIAGNOSIS — Z125 Encounter for screening for malignant neoplasm of prostate: Secondary | ICD-10-CM | POA: Diagnosis not present

## 2023-05-20 DIAGNOSIS — Z0000 Encounter for general adult medical examination without abnormal findings: Secondary | ICD-10-CM

## 2023-05-20 MED ORDER — TADALAFIL 10 MG PO TABS: 10.0000 mg | ORAL_TABLET | Freq: Every day | ORAL | 5 refills | Status: AC | PRN

## 2023-05-20 MED ORDER — PANTOPRAZOLE SODIUM 40 MG PO TBEC
40.0000 mg | DELAYED_RELEASE_TABLET | Freq: Every day | ORAL | 1 refills | Status: AC
  Filled 2023-05-20 – 2023-05-29 (×3): qty 90, 90d supply, fill #0
  Filled 2024-01-09: qty 90, 90d supply, fill #1

## 2023-05-20 NOTE — Patient Instructions (Signed)
 Medications sent in.  Labs today.  Follow up annually or sooner as needed.  Take care  Dr. Adriana Simas

## 2023-05-20 NOTE — Progress Notes (Signed)
 Subjective:  Patient ID: Andrew Vargas, male    DOB: 1961-02-18  Age: 63 y.o. MRN: [id number]  CC:   Chief Complaint  Patient presents with   Annual Exam    HPI:  63 year old male presents for an annual physical exam.  Patient states that overall he is doing well.  Patient reports that he had all short period of time where he was not smoking but has returned back to smoking 1 pack/day.  Does not want to restart Wellbutrin.  Did not tolerate Chantix.  In regards to his preventative health care, patient is in need of pneumococcal vaccine, influenza vaccine, and shingles vaccine.  He wants to wait on pneumococcal vaccine given upcoming trip.  He is leaving tomorrow.  Declines flu vaccine.  Advised that he can get shingles vaccine at his pharmacy.  Patient needs medication refills on pantoprazole.  He is also requesting Cialis regarding erectile dysfunction.  Patient Active Problem List   Diagnosis Date Noted   Aortic atherosclerosis (HCC) 11/12/2021   Hyperlipidemia 11/12/2021   Tobacco abuse 11/12/2021   BPH (benign prostatic hyperplasia) 11/12/2021   Annual physical exam 11/12/2021   Insomnia 11/12/2021   Erectile dysfunction 04/27/2013   GERD (gastroesophageal reflux disease) 11/07/2011    Social Hx   Social History   Socioeconomic History   Marital status: Married    Spouse name: Not on file   Number of children: Not on file   Years of education: Not on file   Highest education level: Not on file  Occupational History   Occupation: full time    Employer: [employer]  Tobacco Use   Smoking status: Every Day    Current packs/day: 1.00    Average packs/day: 1 pack/day for 40.0 years (40.0 ttl pk-yrs)    Types: Cigarettes   Smokeless tobacco: Never  Vaping Use   Vaping status: Never Used  Substance and Sexual Activity   Alcohol use: Yes    Alcohol/week: 0.0 standard drinks of alcohol    Comment: socially   Drug use: No   Sexual activity: Not on file   Other Topics Concern   Not on file  Social History Narrative   Not on file   Social Drivers of Health   Financial Resource Strain: Not on file  Food Insecurity: Not on file  Transportation Needs: Not on file  Physical Activity: Not on file  Stress: Not on file  Social Connections: Not on file    Review of Systems  Constitutional: Negative.   Eyes: Negative.   Respiratory: Negative.    Cardiovascular: Negative.      Objective:  Pulse 88   Temp 98 F (36.7 C)   Ht 6' (1.829 m)   SpO2 95%   BMI 29.84 kg/m      07/16/2022    8:18 AM 05/15/2022    9:08 AM 11/12/2021    8:55 AM  BP/Weight  Systolic BP 125 120 133  Diastolic BP 77 70 89  Wt. (Lbs)  220 209.6  BMI  29.84 kg/m2 28.43 kg/m2    Physical Exam Vitals and nursing note reviewed.  Constitutional:      General: He is not in acute distress.    Appearance: Normal appearance.  HENT:     Head: Normocephalic and atraumatic.  Eyes:     General:        Right eye: No discharge.        Left eye: No discharge.     Conjunctiva/sclera: Conjunctivae  normal.  Cardiovascular:     Rate and Rhythm: Normal rate and regular rhythm.  Pulmonary:     Effort: Pulmonary effort is normal.     Breath sounds: Normal breath sounds. No wheezing, rhonchi or rales.  Neurological:     Mental Status: He is alert.  Psychiatric:        Mood and Affect: Mood normal.        Behavior: Behavior normal.     Lab Results  Component Value Date   WBC 9.2 05/15/2022   HGB 18.1 (H) 05/15/2022   HCT 53.4 (H) 05/15/2022   PLT 214 05/15/2022   GLUCOSE 85 05/15/2022   CHOL 224 (H) 05/15/2022   TRIG 112 05/15/2022   HDL 46 05/15/2022   LDLCALC 158 (H) 05/15/2022   ALT 32 05/15/2022   AST 22 05/15/2022   NA 136 05/15/2022   K 4.6 05/15/2022   CL 100 05/15/2022   CREATININE 0.86 05/15/2022   BUN 12 05/15/2022   CO2 24 05/15/2022   TSH 1.730 01/15/2016   PSA 1.08 05/03/2013     Assessment & Plan:  Annual physical  exam Assessment & Plan: Overall doing well.  Advised smoking cessation. He continues to have difficulty with this.  He did go a period of time without smoking but then stopped Wellbutrin and now has returned to smoking a pack a day.  He has tried Chantix in the past as well.  He had adverse effects from this. He is compliant with lung cancer screening. He is amenable to pneumococcal vaccine but wants to wait due to an upcoming trip.  He is going out of town tomorrow to the [place]. Declines flu vaccine. Labs today.   Gastroesophageal reflux disease without esophagitis -     Pantoprazole Sodium; Take 1 tablet (40 mg total) by mouth daily.  Dispense: 90 tablet; Refill: 1  Pure hypercholesterolemia -     Lipid panel  Screening PSA (prostate specific antigen) -     PSA  Elevated hemoglobin (HCC) -     CBC  Screening for metabolic disorder -     CMP14+EGFR  Other orders -     Tadalafil; Take 1 tablet (10 mg total) by mouth daily as needed for erectile dysfunction.  Dispense: 10 tablet; Refill: 5    Follow-up:  Annually  Everlene Other DO [facility]

## 2023-05-20 NOTE — Assessment & Plan Note (Signed)
 Overall doing well.  Advised smoking cessation. He continues to have difficulty with this.  He did go a period of time without smoking but then stopped Wellbutrin and now has returned to smoking a pack a day.  He has tried Chantix in the past as well.  He had adverse effects from this. He is compliant with lung cancer screening. He is amenable to pneumococcal vaccine but wants to wait due to an upcoming trip.  He is going out of town tomorrow to the [place]. Declines flu vaccine. Labs today.

## 2023-05-21 LAB — CMP14+EGFR
ALT: 21 IU/L (ref 0–44)
AST: 20 IU/L (ref 0–40)
Albumin: 4.4 g/dL (ref 3.9–4.9)
Alkaline Phosphatase: 77 IU/L (ref 44–121)
BUN/Creatinine Ratio: 14 (ref 10–24)
BUN: 15 mg/dL (ref 8–27)
Bilirubin Total: 0.4 mg/dL (ref 0.0–1.2)
CO2: 24 mmol/L (ref 20–29)
Calcium: 9.5 mg/dL (ref 8.6–10.2)
Chloride: 103 mmol/L (ref 96–106)
Creatinine, Ser: 1.04 mg/dL (ref 0.76–1.27)
Globulin, Total: 2.7 g/dL (ref 1.5–4.5)
Glucose: 84 mg/dL (ref 70–99)
Potassium: 4.5 mmol/L (ref 3.5–5.2)
Sodium: 140 mmol/L (ref 134–144)
Total Protein: 7.1 g/dL (ref 6.0–8.5)
eGFR: 82 mL/min/1.73

## 2023-05-21 LAB — CBC
Hematocrit: 52.1 % — ABNORMAL HIGH (ref 37.5–51.0)
Hemoglobin: 17.1 g/dL (ref 13.0–17.7)
MCH: 28 pg (ref 26.6–33.0)
MCHC: 32.8 g/dL (ref 31.5–35.7)
MCV: 85 fL (ref 79–97)
Platelets: 212 10*3/uL (ref 150–450)
RBC: 6.11 x10E6/uL — ABNORMAL HIGH (ref 4.14–5.80)
RDW: 14 % (ref 11.6–15.4)
WBC: 7.5 10*3/uL (ref 3.4–10.8)

## 2023-05-21 LAB — LIPID PANEL
Chol/HDL Ratio: 5 {ratio} (ref 0.0–5.0)
Cholesterol, Total: 216 mg/dL — ABNORMAL HIGH (ref 100–199)
HDL: 43 mg/dL (ref >39)
LDL Chol Calc (NIH): 152 mg/dL — ABNORMAL HIGH (ref 0–99)
Triglycerides: 116 mg/dL (ref 0–149)
VLDL Cholesterol Cal: 21 mg/dL (ref 5–40)

## 2023-05-21 LAB — PSA: Prostate Specific Ag, Serum: 1.3 ng/mL (ref 0.0–4.0)

## 2023-05-25 ENCOUNTER — Encounter: Payer: Self-pay | Admitting: Family Medicine

## 2023-05-28 ENCOUNTER — Other Ambulatory Visit: Payer: Self-pay | Admitting: Family Medicine

## 2023-05-28 MED ORDER — ROSUVASTATIN CALCIUM 10 MG PO TABS
10.0000 mg | ORAL_TABLET | Freq: Every day | ORAL | 3 refills | Status: AC
  Filled 2023-05-28 – 2023-05-29 (×3): qty 90, 90d supply, fill #0
  Filled 2024-01-09: qty 90, 90d supply, fill #1

## 2023-05-29 ENCOUNTER — Other Ambulatory Visit (HOSPITAL_COMMUNITY): Payer: Self-pay

## 2023-05-29 ENCOUNTER — Other Ambulatory Visit: Payer: Self-pay

## 2023-05-29 ENCOUNTER — Other Ambulatory Visit (HOSPITAL_BASED_OUTPATIENT_CLINIC_OR_DEPARTMENT_OTHER): Payer: Self-pay

## 2023-06-02 ENCOUNTER — Other Ambulatory Visit (HOSPITAL_COMMUNITY): Payer: Self-pay

## 2023-06-04 ENCOUNTER — Other Ambulatory Visit (HOSPITAL_COMMUNITY): Payer: Self-pay

## 2023-06-06 ENCOUNTER — Other Ambulatory Visit (HOSPITAL_COMMUNITY): Payer: Self-pay

## 2023-09-03 ENCOUNTER — Ambulatory Visit: Payer: 59 | Admitting: Dermatology

## 2023-09-16 ENCOUNTER — Ambulatory Visit: Admitting: Dermatology

## 2023-09-16 DIAGNOSIS — L814 Other melanin hyperpigmentation: Secondary | ICD-10-CM | POA: Diagnosis not present

## 2023-09-16 DIAGNOSIS — L578 Other skin changes due to chronic exposure to nonionizing radiation: Secondary | ICD-10-CM | POA: Diagnosis not present

## 2023-09-16 DIAGNOSIS — D229 Melanocytic nevi, unspecified: Secondary | ICD-10-CM

## 2023-09-16 DIAGNOSIS — W908XXA Exposure to other nonionizing radiation, initial encounter: Secondary | ICD-10-CM

## 2023-09-16 DIAGNOSIS — Z872 Personal history of diseases of the skin and subcutaneous tissue: Secondary | ICD-10-CM

## 2023-09-16 DIAGNOSIS — L821 Other seborrheic keratosis: Secondary | ICD-10-CM | POA: Diagnosis not present

## 2023-09-16 DIAGNOSIS — Z1283 Encounter for screening for malignant neoplasm of skin: Secondary | ICD-10-CM

## 2023-09-16 DIAGNOSIS — Z85828 Personal history of other malignant neoplasm of skin: Secondary | ICD-10-CM | POA: Diagnosis not present

## 2023-09-16 DIAGNOSIS — D1801 Hemangioma of skin and subcutaneous tissue: Secondary | ICD-10-CM

## 2023-09-16 DIAGNOSIS — L570 Actinic keratosis: Secondary | ICD-10-CM | POA: Diagnosis not present

## 2023-09-16 DIAGNOSIS — Z8589 Personal history of malignant neoplasm of other organs and systems: Secondary | ICD-10-CM

## 2023-09-16 DIAGNOSIS — L820 Inflamed seborrheic keratosis: Secondary | ICD-10-CM | POA: Diagnosis not present

## 2023-09-16 NOTE — Patient Instructions (Addendum)

## 2023-09-16 NOTE — Progress Notes (Unsigned)
 Follow-Up Visit   Subjective  Andrew Vargas is a 63 y.o. male who presents for the following: Skin Cancer Screening and Full Body Skin Exam, hx of SCC, hx of precancers   The patient presents for Total-Body Skin Exam (TBSE) for skin cancer screening and mole check. The patient has spots, moles and lesions to be evaluated, some may be new or changing and the patient may have concern these could be cancer.  The following portions of the chart were reviewed this encounter and updated as appropriate: medications, allergies, medical history  Review of Systems:  No other skin or systemic complaints except as noted in HPI or Assessment and Plan.  Objective  Well appearing patient in no apparent distress; mood and affect are within normal limits.  A full examination was performed including scalp, head, eyes, ears, nose, lips, neck, chest, axillae, abdomen, back, buttocks, bilateral upper extremities, bilateral lower extremities, hands, feet, fingers, toes, fingernails, and toenails. All findings within normal limits unless otherwise noted below.   Relevant physical exam findings are noted in the Assessment and Plan.  left neck post auricular x 1, right neck post auricular x 2 (3) Stuck-on, waxy, tan-brown papules and plaques -- Discussed benign etiology and prognosis.  scalp x 3, nose x 1 (4) Erythematous thin papules/macules with gritty scale.   Assessment & Plan   SKIN CANCER SCREENING PERFORMED TODAY.  ACTINIC DAMAGE - Chronic condition, secondary to cumulative UV/sun exposure - diffuse scaly erythematous macules with underlying dyspigmentation - Recommend daily broad spectrum sunscreen SPF 30+ to sun-exposed areas, reapply every 2 hours as needed.  - Staying in the shade or wearing long sleeves, sun glasses (UVA+UVB protection) and wide brim hats (4-inch brim around the entire circumference of the hat) are also recommended for sun protection.  - Call for new or changing  lesions.  LENTIGINES, SEBORRHEIC KERATOSES, HEMANGIOMAS - Benign normal skin lesions - Benign-appearing - Call for any changes  MELANOCYTIC NEVI - Tan-brown and/or pink-flesh-colored symmetric macules and papules - Benign appearing on exam today - Observation - Call clinic for new or changing moles - Recommend daily use of broad spectrum spf 30+ sunscreen to sun-exposed areas.   HISTORY OF SQUAMOUS CELL CARCINOMA OF THE SKIN See history  - No evidence of recurrence today - No lymphadenopathy - Recommend regular full body skin exams - Recommend daily broad spectrum sunscreen SPF 30+ to sun-exposed areas, reapply every 2 hours as needed.  - Call if any new or changing lesions are noted between office visits   INFLAMED SEBORRHEIC KERATOSIS (3) left neck post auricular x 1, right neck post auricular x 2 (3) Symptomatic, irritating, patient would like treated.  Destruction of lesion - left neck post auricular x 1, right neck post auricular x 2 (3) Complexity: simple   Destruction method: cryotherapy   Informed consent: discussed and consent obtained   Timeout:  patient name, date of birth, surgical site, and procedure verified Lesion destroyed using liquid nitrogen: Yes   Region frozen until ice ball extended beyond lesion: Yes   Outcome: patient tolerated procedure well with no complications   Post-procedure details: wound care instructions given   AK (ACTINIC KERATOSIS) (4) scalp x 3, nose x 1 (4) ACTINIC DAMAGE - chronic, secondary to cumulative UV radiation exposure/sun exposure over time - diffuse scaly erythematous macules with underlying dyspigmentation - Recommend daily broad spectrum sunscreen SPF 30+ to sun-exposed areas, reapply every 2 hours as needed.  - Recommend staying in the shade or  wearing long sleeves, sun glasses (UVA+UVB protection) and wide brim hats (4-inch brim around the entire circumference of the hat). - Call for new or changing lesions.  Destruction of  lesion - scalp x 3, nose x 1 (4) Complexity: simple   Destruction method: cryotherapy   Informed consent: discussed and consent obtained   Timeout:  patient name, date of birth, surgical site, and procedure verified Lesion destroyed using liquid nitrogen: Yes   Region frozen until ice ball extended beyond lesion: Yes   Outcome: patient tolerated procedure well with no complications   Post-procedure details: wound care instructions given     Return in about 9 months (around 06/15/2024) for TBSE, hx of SCC.  IFay Vargas, CMA, am acting as scribe for Andrew Rhyme, MD .   Documentation: I have reviewed the above documentation for accuracy and completeness, and I agree with the above.  Andrew Rhyme, MD

## 2023-09-17 ENCOUNTER — Encounter: Payer: Self-pay | Admitting: Dermatology

## 2023-09-30 DIAGNOSIS — D3131 Benign neoplasm of right choroid: Secondary | ICD-10-CM | POA: Diagnosis not present

## 2023-09-30 DIAGNOSIS — H524 Presbyopia: Secondary | ICD-10-CM | POA: Diagnosis not present

## 2023-10-16 ENCOUNTER — Ambulatory Visit: Admitting: Family Medicine

## 2023-11-03 ENCOUNTER — Encounter: Payer: Self-pay | Admitting: Family Medicine

## 2023-11-03 ENCOUNTER — Ambulatory Visit: Admitting: Family Medicine

## 2023-11-03 VITALS — BP 130/87 | HR 74 | Temp 98.4°F | Ht 72.0 in | Wt 217.0 lb

## 2023-11-03 DIAGNOSIS — Z23 Encounter for immunization: Secondary | ICD-10-CM

## 2023-11-03 DIAGNOSIS — Z720 Tobacco use: Secondary | ICD-10-CM

## 2023-11-03 DIAGNOSIS — N50812 Left testicular pain: Secondary | ICD-10-CM

## 2023-11-03 NOTE — Assessment & Plan Note (Signed)
 No appreciable hernia.  No mass.  Possible varicocele.  Arranging for ultrasound for further evaluation.

## 2023-11-03 NOTE — Progress Notes (Signed)
 Subjective:  Patient ID: Andrew Vargas, male    DOB: 10-Apr-1960  Age: 63 y.o. MRN: [id number]  CC:   Chief Complaint  Patient presents with   Edema    Ongoing swelling in genitals and now having pain in testicles     HPI:  63 year old male with the below mentioned medical problems presents for follow-up.  Patient reports that he has had ongoing fullness to the left groin.  He states that he has been experiencing some intermittent left testicle pain for the past 2 months.  He is concerned about this.  No mass.  He reports that he has had a prior vasectomy.  No other associated symptoms.  Blood pressure well-controlled.  Patient continues to smoke.  He has tried Chantix , nicotine replacement, and Wellbutrin .  Patient Active Problem List   Diagnosis Date Noted   Pain in left testicle 11/03/2023   Aortic atherosclerosis (HCC) 11/12/2021   Hyperlipidemia 11/12/2021   Tobacco abuse 11/12/2021   BPH (benign prostatic hyperplasia) 11/12/2021   Annual physical exam 11/12/2021   Insomnia 11/12/2021   Erectile dysfunction 04/27/2013   GERD (gastroesophageal reflux disease) 11/07/2011    Social Hx   Social History   Socioeconomic History   Marital status: Married    Spouse name: Not on file   Number of children: Not on file   Years of education: Not on file   Highest education level: Not on file  Occupational History   Occupation: full time    Employer: [employer]  Tobacco Use   Smoking status: Every Day    Current packs/day: 1.00    Average packs/day: 1 pack/day for 40.0 years (40.0 ttl pk-yrs)    Types: Cigarettes   Smokeless tobacco: Never  Vaping Use   Vaping status: Never Used  Substance and Sexual Activity   Alcohol use: Yes    Alcohol/week: 0.0 standard drinks of alcohol    Comment: socially   Drug use: No   Sexual activity: Not on file  Other Topics Concern   Not on file  Social History Narrative   Not on file   Social Drivers of Health    Financial Resource Strain: Not on file  Food Insecurity: Not on file  Transportation Needs: Not on file  Physical Activity: Not on file  Stress: Not on file  Social Connections: Not on file    Review of Systems Per HPI  Objective:  BP 130/87   Pulse 74   Temp 98.4 F (36.9 C)   Ht 6' (1.829 m)   Wt 217 lb (98.4 kg)   SpO2 98%   BMI 29.43 kg/m      11/03/2023    9:29 AM 07/16/2022    8:18 AM 05/15/2022    9:08 AM  BP/Weight  Systolic BP 130 125 120  Diastolic BP 87 77 70  Wt. (Lbs) 217  220  BMI 29.43 kg/m2  29.84 kg/m2    Physical Exam Constitutional:      General: He is not in acute distress.    Appearance: Normal appearance.  HENT:     Head: Normocephalic and atraumatic.  Cardiovascular:     Rate and Rhythm: Normal rate and regular rhythm.  Pulmonary:     Effort: Pulmonary effort is normal.     Breath sounds: Normal breath sounds.  Genitourinary:    Comments: No hernia.  No testicular mass that I can appreciate.  Possible varicocele on the left side. Neurological:     Mental Status:  He is alert.     Lab Results  Component Value Date   WBC 7.5 05/20/2023   HGB 17.1 05/20/2023   HCT 52.1 (H) 05/20/2023   PLT 212 05/20/2023   GLUCOSE 84 05/20/2023   CHOL 216 (H) 05/20/2023   TRIG 116 05/20/2023   HDL 43 05/20/2023   LDLCALC 152 (H) 05/20/2023   ALT 21 05/20/2023   AST 20 05/20/2023   NA 140 05/20/2023   K 4.5 05/20/2023   CL 103 05/20/2023   CREATININE 1.04 05/20/2023   BUN 15 05/20/2023   CO2 24 05/20/2023   TSH 1.730 01/15/2016   PSA 1.08 05/03/2013     Assessment & Plan:  Pain in left testicle Assessment & Plan: No appreciable hernia.  No mass.  Possible varicocele.  Arranging for ultrasound for further evaluation.  Orders: -     US  SCROTUM W/DOPPLER  Immunization due -     Pneumococcal conjugate vaccine 20-valent  Tobacco abuse Assessment & Plan: Patient to work on smoking cessation.    Follow-up: 6 months  Berle Fitz Bluford  DO [facility]

## 2023-11-03 NOTE — Assessment & Plan Note (Signed)
 Patient to work on smoking cessation.

## 2023-11-03 NOTE — Patient Instructions (Signed)
 US  ordered. We will call/message with results.  Follow up in 6 months.  Take care  Dr. Bluford

## 2023-11-06 ENCOUNTER — Ambulatory Visit (HOSPITAL_COMMUNITY)
Admission: RE | Admit: 2023-11-06 | Discharge: 2023-11-06 | Disposition: A | Discharge status: Home / Self Care | Source: Ambulatory Visit | Attending: Family Medicine | Admitting: Family Medicine

## 2023-11-06 ENCOUNTER — Ambulatory Visit: Payer: Self-pay | Admitting: Family Medicine

## 2023-11-06 DIAGNOSIS — N50812 Left testicular pain: Secondary | ICD-10-CM | POA: Diagnosis not present

## 2023-11-06 DIAGNOSIS — N503 Cyst of epididymis: Secondary | ICD-10-CM | POA: Diagnosis not present

## 2024-01-09 ENCOUNTER — Other Ambulatory Visit (HOSPITAL_COMMUNITY): Payer: Self-pay

## 2024-01-19 ENCOUNTER — Ambulatory Visit (HOSPITAL_COMMUNITY)

## 2024-05-05 ENCOUNTER — Ambulatory Visit: Admitting: Family Medicine

## 2024-05-20 ENCOUNTER — Encounter: Payer: Self-pay | Admitting: Family Medicine

## 2024-06-15 ENCOUNTER — Ambulatory Visit: Admitting: Dermatology
